# Patient Record
Sex: Male | Born: 1935 | Race: White | Hispanic: No | Marital: Married | Smoking: Former smoker
Health system: Southern US, Community
[De-identification: ages and names within clinical notes are randomized; demographics above are authoritative.]

## PROBLEM LIST (undated history)

## (undated) DIAGNOSIS — I739 Peripheral vascular disease, unspecified: Secondary | ICD-10-CM

## (undated) DIAGNOSIS — R011 Cardiac murmur, unspecified: Secondary | ICD-10-CM

## (undated) DIAGNOSIS — C801 Malignant (primary) neoplasm, unspecified: Secondary | ICD-10-CM

## (undated) DIAGNOSIS — M1990 Unspecified osteoarthritis, unspecified site: Secondary | ICD-10-CM

## (undated) DIAGNOSIS — I639 Cerebral infarction, unspecified: Secondary | ICD-10-CM

## (undated) DIAGNOSIS — I209 Angina pectoris, unspecified: Secondary | ICD-10-CM

## (undated) DIAGNOSIS — I219 Acute myocardial infarction, unspecified: Secondary | ICD-10-CM

## (undated) DIAGNOSIS — I10 Essential (primary) hypertension: Secondary | ICD-10-CM

## (undated) DIAGNOSIS — I2510 Atherosclerotic heart disease of native coronary artery without angina pectoris: Secondary | ICD-10-CM

## (undated) HISTORY — PX: HERNIA REPAIR: SHX51

## (undated) HISTORY — PX: CORONARY ARTERY BYPASS GRAFT: SHX141

## (undated) HISTORY — PX: CARDIAC CATHETERIZATION: SHX172

## (undated) HISTORY — PX: CATARACT EXTRACTION, BILATERAL: SHX1313

## (undated) HISTORY — DX: Cerebral infarction, unspecified: I63.9

---

## 1998-01-04 ENCOUNTER — Ambulatory Visit (HOSPITAL_COMMUNITY): Admission: RE | Admit: 1998-01-04 | Discharge: 1998-01-04 | Payer: Self-pay | Admitting: Urology

## 1998-06-21 ENCOUNTER — Ambulatory Visit (HOSPITAL_COMMUNITY): Admission: RE | Admit: 1998-06-21 | Discharge: 1998-06-21 | Payer: Self-pay | Admitting: Urology

## 2001-03-12 ENCOUNTER — Encounter: Payer: Self-pay | Admitting: Internal Medicine

## 2001-03-12 ENCOUNTER — Inpatient Hospital Stay (HOSPITAL_COMMUNITY): Admission: EM | Admit: 2001-03-12 | Discharge: 2001-03-13 | Payer: Self-pay | Admitting: Emergency Medicine

## 2001-06-19 ENCOUNTER — Encounter: Payer: Self-pay | Admitting: Vascular Surgery

## 2001-06-23 ENCOUNTER — Encounter (INDEPENDENT_AMBULATORY_CARE_PROVIDER_SITE_OTHER): Payer: Self-pay | Admitting: *Deleted

## 2001-06-23 ENCOUNTER — Inpatient Hospital Stay (HOSPITAL_COMMUNITY): Admission: RE | Admit: 2001-06-23 | Discharge: 2001-06-24 | Payer: Self-pay | Admitting: Vascular Surgery

## 2001-07-17 ENCOUNTER — Ambulatory Visit (HOSPITAL_COMMUNITY): Admission: RE | Admit: 2001-07-17 | Discharge: 2001-07-17 | Payer: Self-pay | Admitting: Vascular Surgery

## 2001-07-17 ENCOUNTER — Encounter: Payer: Self-pay | Admitting: Vascular Surgery

## 2002-01-08 HISTORY — PX: WRIST SURGERY: SHX841

## 2003-02-03 ENCOUNTER — Ambulatory Visit (HOSPITAL_COMMUNITY): Admission: RE | Admit: 2003-02-03 | Discharge: 2003-02-03 | Payer: Self-pay | Admitting: Urology

## 2003-05-25 ENCOUNTER — Encounter (INDEPENDENT_AMBULATORY_CARE_PROVIDER_SITE_OTHER): Payer: Self-pay | Admitting: *Deleted

## 2003-05-25 ENCOUNTER — Inpatient Hospital Stay (HOSPITAL_COMMUNITY): Admission: RE | Admit: 2003-05-25 | Discharge: 2003-05-26 | Payer: Self-pay | Admitting: Vascular Surgery

## 2003-06-23 ENCOUNTER — Ambulatory Visit (HOSPITAL_COMMUNITY): Admission: RE | Admit: 2003-06-23 | Discharge: 2003-06-24 | Payer: Self-pay | Admitting: Vascular Surgery

## 2004-10-05 ENCOUNTER — Inpatient Hospital Stay (HOSPITAL_BASED_OUTPATIENT_CLINIC_OR_DEPARTMENT_OTHER): Admission: RE | Admit: 2004-10-05 | Discharge: 2004-10-05 | Payer: Self-pay | Admitting: Interventional Cardiology

## 2004-10-10 ENCOUNTER — Ambulatory Visit (HOSPITAL_COMMUNITY): Admission: RE | Admit: 2004-10-10 | Discharge: 2004-10-11 | Payer: Self-pay | Admitting: Interventional Cardiology

## 2005-03-21 ENCOUNTER — Ambulatory Visit (HOSPITAL_COMMUNITY): Admission: RE | Admit: 2005-03-21 | Discharge: 2005-03-22 | Payer: Self-pay | Admitting: Vascular Surgery

## 2005-11-28 ENCOUNTER — Encounter: Admission: RE | Admit: 2005-11-28 | Discharge: 2005-11-28 | Payer: Self-pay | Admitting: Interventional Cardiology

## 2005-11-30 ENCOUNTER — Ambulatory Visit (HOSPITAL_COMMUNITY): Admission: RE | Admit: 2005-11-30 | Discharge: 2005-12-01 | Payer: Self-pay | Admitting: Interventional Cardiology

## 2006-05-21 ENCOUNTER — Ambulatory Visit: Payer: Self-pay | Admitting: Oncology

## 2006-06-17 ENCOUNTER — Ambulatory Visit: Payer: Self-pay | Admitting: Vascular Surgery

## 2006-07-04 LAB — COMPREHENSIVE METABOLIC PANEL
ALT: 15 U/L (ref 0–53)
BUN: 23 mg/dL (ref 6–23)
CO2: 23 mEq/L (ref 19–32)
Calcium: 9.1 mg/dL (ref 8.4–10.5)
Creatinine, Ser: 1.57 mg/dL — ABNORMAL HIGH (ref 0.40–1.50)
Glucose, Bld: 133 mg/dL — ABNORMAL HIGH (ref 70–99)
Total Bilirubin: 0.4 mg/dL (ref 0.3–1.2)

## 2006-07-04 LAB — CBC WITH DIFFERENTIAL/PLATELET
BASO%: 0.4 % (ref 0.0–2.0)
EOS%: 3.4 % (ref 0.0–7.0)
LYMPH%: 25.2 % (ref 14.0–48.0)
MCH: 31.3 pg (ref 28.0–33.4)
MCHC: 34.7 g/dL (ref 32.0–35.9)
MONO#: 0.5 10*3/uL (ref 0.1–0.9)
Platelets: 281 10*3/uL (ref 145–400)
RBC: 3.03 10*6/uL — ABNORMAL LOW (ref 4.20–5.71)
WBC: 6 10*3/uL (ref 4.0–10.0)

## 2006-07-04 LAB — CHCC SMEAR

## 2006-07-04 LAB — RETICULOCYTES: IRF: 0.43 — ABNORMAL HIGH (ref 0.070–0.380)

## 2006-07-05 ENCOUNTER — Ambulatory Visit (HOSPITAL_COMMUNITY): Admission: RE | Admit: 2006-07-05 | Discharge: 2006-07-05 | Payer: Self-pay | Admitting: Oncology

## 2006-07-08 ENCOUNTER — Ambulatory Visit: Payer: Self-pay | Admitting: Oncology

## 2006-07-08 LAB — FERRITIN: Ferritin: 26 ng/mL (ref 22–322)

## 2006-07-08 LAB — PROTEIN ELECTROPHORESIS, SERUM
Albumin ELP: 63.5 % (ref 55.8–66.1)
Alpha-1-Globulin: 4.2 % (ref 2.9–4.9)
Alpha-2-Globulin: 10.5 % (ref 7.1–11.8)

## 2006-07-15 ENCOUNTER — Other Ambulatory Visit: Admission: RE | Admit: 2006-07-15 | Discharge: 2006-07-15 | Payer: Self-pay | Admitting: Oncology

## 2006-07-15 ENCOUNTER — Encounter: Payer: Self-pay | Admitting: Oncology

## 2006-08-05 LAB — CBC WITH DIFFERENTIAL/PLATELET
Basophils Absolute: 0 10*3/uL (ref 0.0–0.1)
Eosinophils Absolute: 0.2 10*3/uL (ref 0.0–0.5)
HCT: 31.9 % — ABNORMAL LOW (ref 38.7–49.9)
HGB: 11 g/dL — ABNORMAL LOW (ref 13.0–17.1)
LYMPH%: 22.7 % (ref 14.0–48.0)
MONO#: 0.6 10*3/uL (ref 0.1–0.9)
NEUT%: 63.4 % (ref 40.0–75.0)
Platelets: 268 10*3/uL (ref 145–400)
WBC: 6.2 10*3/uL (ref 4.0–10.0)
lymph#: 1.4 10*3/uL (ref 0.9–3.3)

## 2006-08-19 LAB — CBC WITH DIFFERENTIAL/PLATELET
Basophils Absolute: 0 10*3/uL (ref 0.0–0.1)
EOS%: 3.8 % (ref 0.0–7.0)
HCT: 33.5 % — ABNORMAL LOW (ref 38.7–49.9)
HGB: 11.6 g/dL — ABNORMAL LOW (ref 13.0–17.1)
LYMPH%: 24.6 % (ref 14.0–48.0)
MCH: 31.7 pg (ref 28.0–33.4)
MCV: 92 fL (ref 81.6–98.0)
NEUT%: 61.7 % (ref 40.0–75.0)
Platelets: 292 10*3/uL (ref 145–400)
lymph#: 1.2 10*3/uL (ref 0.9–3.3)

## 2006-09-10 ENCOUNTER — Ambulatory Visit: Payer: Self-pay | Admitting: Oncology

## 2006-09-10 LAB — CBC WITH DIFFERENTIAL/PLATELET
BASO%: 0.4 % (ref 0.0–2.0)
Basophils Absolute: 0 10*3/uL (ref 0.0–0.1)
EOS%: 2.9 % (ref 0.0–7.0)
HGB: 13.8 g/dL (ref 13.0–17.1)
MCH: 31.2 pg (ref 28.0–33.4)
MCHC: 34.3 g/dL (ref 32.0–35.9)
MCV: 91.1 fL (ref 81.6–98.0)
MONO%: 7.4 % (ref 0.0–13.0)
NEUT%: 63.6 % (ref 40.0–75.0)
RDW: 14.7 % — ABNORMAL HIGH (ref 11.2–14.6)

## 2006-10-01 LAB — CBC WITH DIFFERENTIAL/PLATELET
EOS%: 2.7 % (ref 0.0–7.0)
MCH: 30.5 pg (ref 28.0–33.4)
MCV: 89.3 fL (ref 81.6–98.0)
MONO%: 7.6 % (ref 0.0–13.0)
NEUT#: 4 10*3/uL (ref 1.5–6.5)
RBC: 4.16 10*6/uL — ABNORMAL LOW (ref 4.20–5.71)
RDW: 13.9 % (ref 11.2–14.6)

## 2006-10-01 LAB — BASIC METABOLIC PANEL
BUN: 21 mg/dL (ref 6–23)
CO2: 29 mEq/L (ref 19–32)
Calcium: 9.5 mg/dL (ref 8.4–10.5)
Creatinine, Ser: 1.34 mg/dL (ref 0.40–1.50)
Glucose, Bld: 128 mg/dL — ABNORMAL HIGH (ref 70–99)

## 2006-10-01 LAB — IRON AND TIBC: %SAT: 26 % (ref 20–55)

## 2006-10-29 ENCOUNTER — Ambulatory Visit: Payer: Self-pay | Admitting: Oncology

## 2006-10-31 LAB — CBC WITH DIFFERENTIAL/PLATELET
Basophils Absolute: 0 10*3/uL (ref 0.0–0.1)
Eosinophils Absolute: 0.2 10*3/uL (ref 0.0–0.5)
HCT: 32.7 % — ABNORMAL LOW (ref 38.7–49.9)
HGB: 11.3 g/dL — ABNORMAL LOW (ref 13.0–17.1)
MCV: 88.6 fL (ref 81.6–98.0)
MONO%: 6.6 % (ref 0.0–13.0)
NEUT#: 3.9 10*3/uL (ref 1.5–6.5)
NEUT%: 64.9 % (ref 40.0–75.0)
Platelets: 246 10*3/uL (ref 145–400)
RDW: 14.3 % (ref 11.2–14.6)

## 2006-12-02 LAB — CBC WITH DIFFERENTIAL/PLATELET
Basophils Absolute: 0 10*3/uL (ref 0.0–0.1)
Eosinophils Absolute: 0.2 10*3/uL (ref 0.0–0.5)
LYMPH%: 27.8 % (ref 14.0–48.0)
MCV: 89.1 fL (ref 81.6–98.0)
MONO%: 9.3 % (ref 0.0–13.0)
NEUT#: 3.7 10*3/uL (ref 1.5–6.5)
Platelets: 299 10*3/uL (ref 145–400)
RBC: 4.01 10*6/uL — ABNORMAL LOW (ref 4.20–5.71)

## 2006-12-27 ENCOUNTER — Inpatient Hospital Stay (HOSPITAL_COMMUNITY): Admission: EM | Admit: 2006-12-27 | Discharge: 2006-12-31 | Payer: Self-pay | Admitting: Emergency Medicine

## 2006-12-27 ENCOUNTER — Ambulatory Visit: Payer: Self-pay | Admitting: Oncology

## 2007-01-01 LAB — CBC WITH DIFFERENTIAL/PLATELET
Basophils Absolute: 0 10*3/uL (ref 0.0–0.1)
Eosinophils Absolute: 0.2 10*3/uL (ref 0.0–0.5)
HGB: 9.8 g/dL — ABNORMAL LOW (ref 13.0–17.1)
NEUT#: 6.7 10*3/uL — ABNORMAL HIGH (ref 1.5–6.5)
RBC: 3.17 10*6/uL — ABNORMAL LOW (ref 4.20–5.71)
RDW: 16.8 % — ABNORMAL HIGH (ref 11.2–14.6)
WBC: 8.7 10*3/uL (ref 4.0–10.0)
lymph#: 1.3 10*3/uL (ref 0.9–3.3)

## 2007-01-27 LAB — CBC WITH DIFFERENTIAL/PLATELET
Basophils Absolute: 0.1 10*3/uL (ref 0.0–0.1)
Eosinophils Absolute: 0.2 10*3/uL (ref 0.0–0.5)
HGB: 10.3 g/dL — ABNORMAL LOW (ref 13.0–17.1)
LYMPH%: 23.4 % (ref 14.0–48.0)
MCV: 92.7 fL (ref 81.6–98.0)
MONO#: 0.4 10*3/uL (ref 0.1–0.9)
MONO%: 5.7 % (ref 0.0–13.0)
NEUT#: 4.2 10*3/uL (ref 1.5–6.5)
Platelets: 334 10*3/uL (ref 145–400)
RDW: 17 % — ABNORMAL HIGH (ref 11.2–14.6)
WBC: 6.4 10*3/uL (ref 4.0–10.0)

## 2007-01-31 ENCOUNTER — Ambulatory Visit: Payer: Self-pay | Admitting: Vascular Surgery

## 2007-02-07 LAB — CBC WITH DIFFERENTIAL/PLATELET
Eosinophils Absolute: 0.2 10*3/uL (ref 0.0–0.5)
HCT: 30.5 % — ABNORMAL LOW (ref 38.7–49.9)
LYMPH%: 23.3 % (ref 14.0–48.0)
MCV: 92.7 fL (ref 81.6–98.0)
MONO#: 0.4 10*3/uL (ref 0.1–0.9)
MONO%: 5.9 % (ref 0.0–13.0)
NEUT#: 4.5 10*3/uL (ref 1.5–6.5)
NEUT%: 66.9 % (ref 40.0–75.0)
Platelets: 390 10*3/uL (ref 145–400)
RBC: 3.29 10*6/uL — ABNORMAL LOW (ref 4.20–5.71)
WBC: 6.7 10*3/uL (ref 4.0–10.0)

## 2007-02-07 LAB — IRON AND TIBC
%SAT: 16 % — ABNORMAL LOW (ref 20–55)
Iron: 54 ug/dL (ref 42–165)

## 2007-02-07 LAB — FERRITIN: Ferritin: 43 ng/mL (ref 22–322)

## 2007-03-05 ENCOUNTER — Ambulatory Visit: Payer: Self-pay | Admitting: Oncology

## 2007-03-07 LAB — CBC WITH DIFFERENTIAL/PLATELET
BASO%: 0.6 % (ref 0.0–2.0)
Eosinophils Absolute: 0.2 10*3/uL (ref 0.0–0.5)
LYMPH%: 20.3 % (ref 14.0–48.0)
MCHC: 33.8 g/dL (ref 32.0–35.9)
MCV: 93.3 fL (ref 81.6–98.0)
MONO%: 7.4 % (ref 0.0–13.0)
NEUT%: 69.3 % (ref 40.0–75.0)
Platelets: 338 10*3/uL (ref 145–400)
RBC: 3.5 10*6/uL — ABNORMAL LOW (ref 4.20–5.71)

## 2007-04-30 ENCOUNTER — Ambulatory Visit: Payer: Self-pay | Admitting: Oncology

## 2007-05-02 LAB — CBC WITH DIFFERENTIAL/PLATELET
Eosinophils Absolute: 0.3 10*3/uL (ref 0.0–0.5)
HCT: 32.3 % — ABNORMAL LOW (ref 38.7–49.9)
LYMPH%: 24.3 % (ref 14.0–48.0)
MCHC: 34.5 g/dL (ref 32.0–35.9)
MONO#: 0.6 10*3/uL (ref 0.1–0.9)
NEUT#: 4.8 10*3/uL (ref 1.5–6.5)
NEUT%: 64.1 % (ref 40.0–75.0)
Platelets: 304 10*3/uL (ref 145–400)
WBC: 7.5 10*3/uL (ref 4.0–10.0)

## 2007-05-02 LAB — FERRITIN: Ferritin: 29 ng/mL (ref 22–322)

## 2007-05-02 LAB — IRON AND TIBC: %SAT: 18 % — ABNORMAL LOW (ref 20–55)

## 2007-06-25 ENCOUNTER — Ambulatory Visit: Payer: Self-pay | Admitting: Oncology

## 2007-06-27 LAB — CBC WITH DIFFERENTIAL/PLATELET
Basophils Absolute: 0.1 10*3/uL (ref 0.0–0.1)
Eosinophils Absolute: 0.2 10*3/uL (ref 0.0–0.5)
HGB: 11.4 g/dL — ABNORMAL LOW (ref 13.0–17.1)
LYMPH%: 21.7 % (ref 14.0–48.0)
MONO#: 0.4 10*3/uL (ref 0.1–0.9)
NEUT#: 4.3 10*3/uL (ref 1.5–6.5)
Platelets: 318 10*3/uL (ref 145–400)
RBC: 3.67 10*6/uL — ABNORMAL LOW (ref 4.20–5.71)
WBC: 6.3 10*3/uL (ref 4.0–10.0)

## 2007-07-25 ENCOUNTER — Ambulatory Visit: Payer: Self-pay | Admitting: Vascular Surgery

## 2007-08-08 LAB — CBC WITH DIFFERENTIAL/PLATELET
BASO%: 0.3 % (ref 0.0–2.0)
HCT: 31 % — ABNORMAL LOW (ref 38.7–49.9)
MCHC: 33.9 g/dL (ref 32.0–35.9)
MONO#: 0.4 10*3/uL (ref 0.1–0.9)
NEUT%: 64.8 % (ref 40.0–75.0)
RBC: 3.36 10*6/uL — ABNORMAL LOW (ref 4.20–5.71)
RDW: 15.9 % — ABNORMAL HIGH (ref 11.2–14.6)
WBC: 6.8 10*3/uL (ref 4.0–10.0)
lymph#: 1.8 10*3/uL (ref 0.9–3.3)

## 2007-08-08 LAB — IRON AND TIBC: UIBC: 277 ug/dL

## 2007-08-12 ENCOUNTER — Ambulatory Visit: Payer: Self-pay | Admitting: Surgery

## 2007-08-12 ENCOUNTER — Ambulatory Visit (HOSPITAL_COMMUNITY): Admission: RE | Admit: 2007-08-12 | Discharge: 2007-08-13 | Payer: Self-pay | Admitting: Surgery

## 2007-08-21 ENCOUNTER — Ambulatory Visit (HOSPITAL_COMMUNITY): Admission: RE | Admit: 2007-08-21 | Discharge: 2007-08-23 | Payer: Self-pay | Admitting: Surgery

## 2007-08-21 ENCOUNTER — Encounter: Payer: Self-pay | Admitting: Vascular Surgery

## 2007-08-22 ENCOUNTER — Encounter: Payer: Self-pay | Admitting: Surgery

## 2007-08-29 ENCOUNTER — Ambulatory Visit: Payer: Self-pay | Admitting: Vascular Surgery

## 2007-09-16 ENCOUNTER — Ambulatory Visit: Payer: Self-pay | Admitting: Oncology

## 2007-09-19 LAB — CBC WITH DIFFERENTIAL/PLATELET
Basophils Absolute: 0 10*3/uL (ref 0.0–0.1)
Eosinophils Absolute: 0.3 10*3/uL (ref 0.0–0.5)
HGB: 10 g/dL — ABNORMAL LOW (ref 13.0–17.1)
MCV: 92.8 fL (ref 81.6–98.0)
MONO%: 7 % (ref 0.0–13.0)
NEUT#: 4.3 10*3/uL (ref 1.5–6.5)
Platelets: 283 10*3/uL (ref 145–400)
RDW: 15.7 % — ABNORMAL HIGH (ref 11.2–14.6)

## 2007-09-19 LAB — IRON AND TIBC
%SAT: 16 % — ABNORMAL LOW (ref 20–55)
Iron: 56 ug/dL (ref 42–165)

## 2007-09-22 ENCOUNTER — Ambulatory Visit: Payer: Self-pay | Admitting: Surgery

## 2007-10-28 LAB — CBC WITH DIFFERENTIAL/PLATELET
Basophils Absolute: 0 10*3/uL (ref 0.0–0.1)
Eosinophils Absolute: 0.3 10*3/uL (ref 0.0–0.5)
HGB: 10.4 g/dL — ABNORMAL LOW (ref 13.0–17.1)
LYMPH%: 24.5 % (ref 14.0–48.0)
MCV: 92.8 fL (ref 81.6–98.0)
MONO#: 0.4 10*3/uL (ref 0.1–0.9)
MONO%: 6.3 % (ref 0.0–13.0)
NEUT#: 4.2 10*3/uL (ref 1.5–6.5)
Platelets: 270 10*3/uL (ref 145–400)
WBC: 6.4 10*3/uL (ref 4.0–10.0)

## 2007-10-28 LAB — IRON AND TIBC
%SAT: 20 % (ref 20–55)
Iron: 72 ug/dL (ref 42–165)
UIBC: 291 ug/dL

## 2007-10-28 LAB — FERRITIN: Ferritin: 21 ng/mL — ABNORMAL LOW (ref 22–322)

## 2007-11-17 ENCOUNTER — Ambulatory Visit: Payer: Self-pay | Admitting: Surgery

## 2007-11-21 ENCOUNTER — Ambulatory Visit: Payer: Self-pay | Admitting: Oncology

## 2007-11-25 LAB — CBC WITH DIFFERENTIAL/PLATELET
Basophils Absolute: 0 10*3/uL (ref 0.0–0.1)
Eosinophils Absolute: 0.2 10*3/uL (ref 0.0–0.5)
HCT: 32.6 % — ABNORMAL LOW (ref 38.7–49.9)
HGB: 11.1 g/dL — ABNORMAL LOW (ref 13.0–17.1)
LYMPH%: 29.4 % (ref 14.0–48.0)
MONO#: 0.4 10*3/uL (ref 0.1–0.9)
NEUT#: 3.1 10*3/uL (ref 1.5–6.5)
NEUT%: 58.2 % (ref 40.0–75.0)
Platelets: 273 10*3/uL (ref 145–400)
WBC: 5.3 10*3/uL (ref 4.0–10.0)
lymph#: 1.6 10*3/uL (ref 0.9–3.3)

## 2007-12-01 ENCOUNTER — Inpatient Hospital Stay (HOSPITAL_COMMUNITY): Admission: AD | Admit: 2007-12-01 | Discharge: 2007-12-02 | Payer: Self-pay | Admitting: Interventional Cardiology

## 2007-12-23 LAB — CBC WITH DIFFERENTIAL/PLATELET
Basophils Absolute: 0 10*3/uL (ref 0.0–0.1)
Eosinophils Absolute: 0.3 10*3/uL (ref 0.0–0.5)
HCT: 33.2 % — ABNORMAL LOW (ref 38.7–49.9)
HGB: 11.2 g/dL — ABNORMAL LOW (ref 13.0–17.1)
LYMPH%: 25.4 % (ref 14.0–48.0)
MONO#: 0.4 10*3/uL (ref 0.1–0.9)
NEUT#: 2.8 10*3/uL (ref 1.5–6.5)
NEUT%: 59.5 % (ref 40.0–75.0)
Platelets: 231 10*3/uL (ref 145–400)
WBC: 4.7 10*3/uL (ref 4.0–10.0)
lymph#: 1.2 10*3/uL (ref 0.9–3.3)

## 2008-01-15 ENCOUNTER — Ambulatory Visit (HOSPITAL_COMMUNITY): Admission: RE | Admit: 2008-01-15 | Discharge: 2008-01-15 | Payer: Self-pay | Admitting: Gastroenterology

## 2008-01-15 ENCOUNTER — Encounter (INDEPENDENT_AMBULATORY_CARE_PROVIDER_SITE_OTHER): Payer: Self-pay | Admitting: Gastroenterology

## 2008-01-16 ENCOUNTER — Ambulatory Visit: Payer: Self-pay | Admitting: Oncology

## 2008-01-20 LAB — CBC WITH DIFFERENTIAL/PLATELET
Eosinophils Absolute: 0.2 10*3/uL (ref 0.0–0.5)
LYMPH%: 26 % (ref 14.0–48.0)
MONO#: 0.4 10*3/uL (ref 0.1–0.9)
NEUT#: 3.5 10*3/uL (ref 1.5–6.5)
Platelets: 236 10*3/uL (ref 145–400)
RBC: 3.74 10*6/uL — ABNORMAL LOW (ref 4.20–5.71)
WBC: 5.6 10*3/uL (ref 4.0–10.0)
lymph#: 1.5 10*3/uL (ref 0.9–3.3)

## 2008-02-16 ENCOUNTER — Ambulatory Visit: Payer: Self-pay | Admitting: Surgery

## 2008-02-27 ENCOUNTER — Ambulatory Visit: Payer: Self-pay | Admitting: Vascular Surgery

## 2008-03-01 LAB — FERRITIN: Ferritin: 25 ng/mL (ref 22–322)

## 2008-03-01 LAB — CBC WITH DIFFERENTIAL/PLATELET
Basophils Absolute: 0 10*3/uL (ref 0.0–0.1)
EOS%: 4.5 % (ref 0.0–7.0)
HCT: 29.9 % — ABNORMAL LOW (ref 38.4–49.9)
HGB: 10.1 g/dL — ABNORMAL LOW (ref 13.0–17.1)
MCH: 30.7 pg (ref 27.2–33.4)
MCV: 90.6 fL (ref 79.3–98.0)
MONO%: 7.5 % (ref 0.0–14.0)
NEUT%: 66 % (ref 39.0–75.0)
lymph#: 1.3 10*3/uL (ref 0.9–3.3)

## 2008-03-01 LAB — IRON AND TIBC: TIBC: 367 ug/dL (ref 215–435)

## 2008-03-25 ENCOUNTER — Ambulatory Visit: Payer: Self-pay | Admitting: Oncology

## 2008-03-29 LAB — CBC WITH DIFFERENTIAL/PLATELET
Basophils Absolute: 0 10*3/uL (ref 0.0–0.1)
Eosinophils Absolute: 0.3 10*3/uL (ref 0.0–0.5)
HCT: 31 % — ABNORMAL LOW (ref 38.4–49.9)
HGB: 9.9 g/dL — ABNORMAL LOW (ref 13.0–17.1)
LYMPH%: 23.6 % (ref 14.0–49.0)
MCV: 90.1 fL (ref 79.3–98.0)
MONO#: 0.5 10*3/uL (ref 0.1–0.9)
MONO%: 7.5 % (ref 0.0–14.0)
NEUT#: 4.3 10*3/uL (ref 1.5–6.5)
Platelets: 269 10*3/uL (ref 140–400)
WBC: 6.7 10*3/uL (ref 4.0–10.3)
nRBC: 0 % (ref 0–0)

## 2008-03-29 LAB — IRON AND TIBC
%SAT: 17 % — ABNORMAL LOW (ref 20–55)
TIBC: 390 ug/dL (ref 215–435)
UIBC: 325 ug/dL

## 2008-04-26 LAB — CBC WITH DIFFERENTIAL/PLATELET
Basophils Absolute: 0 10*3/uL (ref 0.0–0.1)
Eosinophils Absolute: 0.3 10*3/uL (ref 0.0–0.5)
HGB: 10.8 g/dL — ABNORMAL LOW (ref 13.0–17.1)
MCV: 90 fL (ref 79.3–98.0)
MONO#: 0.4 10*3/uL (ref 0.1–0.9)
NEUT#: 3.6 10*3/uL (ref 1.5–6.5)
RBC: 3.55 10*6/uL — ABNORMAL LOW (ref 4.20–5.82)
RDW: 16 % — ABNORMAL HIGH (ref 11.0–14.6)
WBC: 5.4 10*3/uL (ref 4.0–10.3)
lymph#: 1.2 10*3/uL (ref 0.9–3.3)

## 2008-05-20 ENCOUNTER — Ambulatory Visit: Payer: Self-pay | Admitting: Oncology

## 2008-05-24 LAB — IRON AND TIBC
%SAT: 23 % (ref 20–55)
Iron: 80 ug/dL (ref 42–165)
TIBC: 347 ug/dL (ref 215–435)
UIBC: 267 ug/dL

## 2008-05-24 LAB — FERRITIN: Ferritin: 52 ng/mL (ref 22–322)

## 2008-05-24 LAB — CBC WITH DIFFERENTIAL/PLATELET
BASO%: 0.8 % (ref 0.0–2.0)
HCT: 32.5 % — ABNORMAL LOW (ref 38.4–49.9)
HGB: 11.1 g/dL — ABNORMAL LOW (ref 13.0–17.1)
MCHC: 34.1 g/dL (ref 32.0–36.0)
MONO#: 0.4 10*3/uL (ref 0.1–0.9)
NEUT%: 60.1 % (ref 39.0–75.0)
RDW: 16.4 % — ABNORMAL HIGH (ref 11.0–14.6)
WBC: 5 10*3/uL (ref 4.0–10.3)
lymph#: 1.3 10*3/uL (ref 0.9–3.3)

## 2008-05-25 LAB — BASIC METABOLIC PANEL
Calcium: 9.4 mg/dL (ref 8.4–10.5)
Chloride: 107 mEq/L (ref 96–112)
Creatinine, Ser: 2.18 mg/dL — ABNORMAL HIGH (ref 0.40–1.50)

## 2008-06-04 ENCOUNTER — Ambulatory Visit: Payer: Self-pay | Admitting: Vascular Surgery

## 2008-06-05 ENCOUNTER — Emergency Department (HOSPITAL_COMMUNITY): Admission: EM | Admit: 2008-06-05 | Discharge: 2008-06-05 | Payer: Self-pay | Admitting: Emergency Medicine

## 2008-06-23 ENCOUNTER — Encounter: Admission: RE | Admit: 2008-06-23 | Discharge: 2008-06-23 | Payer: Self-pay | Admitting: Internal Medicine

## 2008-06-24 LAB — CBC WITH DIFFERENTIAL/PLATELET
BASO%: 0.5 % (ref 0.0–2.0)
EOS%: 5 % (ref 0.0–7.0)
LYMPH%: 21.6 % (ref 14.0–49.0)
MCHC: 35 g/dL (ref 32.0–36.0)
MONO#: 0.4 10*3/uL (ref 0.1–0.9)
RBC: 3.52 10*6/uL — ABNORMAL LOW (ref 4.20–5.82)
WBC: 5.5 10*3/uL (ref 4.0–10.3)
lymph#: 1.2 10*3/uL (ref 0.9–3.3)

## 2008-07-20 ENCOUNTER — Ambulatory Visit: Payer: Self-pay | Admitting: Oncology

## 2008-07-22 LAB — CBC WITH DIFFERENTIAL/PLATELET
BASO%: 0.6 % (ref 0.0–2.0)
LYMPH%: 28 % (ref 14.0–49.0)
MCH: 30.4 pg (ref 27.2–33.4)
MCHC: 33.2 g/dL (ref 32.0–36.0)
MCV: 91.5 fL (ref 79.3–98.0)
MONO%: 9.4 % (ref 0.0–14.0)
Platelets: 263 10*3/uL (ref 140–400)
RBC: 3.78 10*6/uL — ABNORMAL LOW (ref 4.20–5.82)

## 2008-08-20 ENCOUNTER — Ambulatory Visit: Payer: Self-pay | Admitting: Oncology

## 2008-08-24 LAB — CBC WITH DIFFERENTIAL/PLATELET
Basophils Absolute: 0 10*3/uL (ref 0.0–0.1)
Eosinophils Absolute: 0.2 10*3/uL (ref 0.0–0.5)
HGB: 10.9 g/dL — ABNORMAL LOW (ref 13.0–17.1)
MONO#: 0.4 10*3/uL (ref 0.1–0.9)
NEUT#: 3 10*3/uL (ref 1.5–6.5)
RBC: 3.37 10*6/uL — ABNORMAL LOW (ref 4.20–5.82)
RDW: 15.2 % — ABNORMAL HIGH (ref 11.0–14.6)
WBC: 4.7 10*3/uL (ref 4.0–10.3)

## 2008-08-24 LAB — IRON AND TIBC
%SAT: 26 % (ref 20–55)
Iron: 91 ug/dL (ref 42–165)
TIBC: 352 ug/dL (ref 215–435)
UIBC: 261 ug/dL

## 2008-09-10 ENCOUNTER — Ambulatory Visit: Payer: Self-pay | Admitting: Vascular Surgery

## 2008-09-21 ENCOUNTER — Ambulatory Visit: Payer: Self-pay | Admitting: Oncology

## 2008-09-21 LAB — CBC WITH DIFFERENTIAL/PLATELET
EOS%: 3.9 % (ref 0.0–7.0)
Eosinophils Absolute: 0.2 10*3/uL (ref 0.0–0.5)
MCH: 30.7 pg (ref 27.2–33.4)
MCV: 92.8 fL (ref 79.3–98.0)
MONO%: 8.8 % (ref 0.0–14.0)
NEUT#: 3.4 10*3/uL (ref 1.5–6.5)
RBC: 3.62 10*6/uL — ABNORMAL LOW (ref 4.20–5.82)
RDW: 13.9 % (ref 11.0–14.6)
lymph#: 1.2 10*3/uL (ref 0.9–3.3)

## 2008-10-19 LAB — CBC WITH DIFFERENTIAL/PLATELET
BASO%: 0.5 % (ref 0.0–2.0)
Basophils Absolute: 0 10*3/uL (ref 0.0–0.1)
EOS%: 4.7 % (ref 0.0–7.0)
HGB: 13 g/dL (ref 13.0–17.1)
MCH: 31.4 pg (ref 27.2–33.4)
MCHC: 34.2 g/dL (ref 32.0–36.0)
MCV: 91.9 fL (ref 79.3–98.0)
MONO%: 8.6 % (ref 0.0–14.0)
RDW: 13.5 % (ref 11.0–14.6)
lymph#: 1.1 10*3/uL (ref 0.9–3.3)

## 2008-11-12 ENCOUNTER — Ambulatory Visit: Payer: Self-pay | Admitting: Oncology

## 2008-11-16 LAB — CBC WITH DIFFERENTIAL/PLATELET
BASO%: 0.4 % (ref 0.0–2.0)
Basophils Absolute: 0 10*3/uL (ref 0.0–0.1)
EOS%: 3.8 % (ref 0.0–7.0)
HCT: 35.3 % — ABNORMAL LOW (ref 38.4–49.9)
HGB: 12.2 g/dL — ABNORMAL LOW (ref 13.0–17.1)
LYMPH%: 25.1 % (ref 14.0–49.0)
MCH: 31.7 pg (ref 27.2–33.4)
MCHC: 34.5 g/dL (ref 32.0–36.0)
MCV: 91.8 fL (ref 79.3–98.0)
MONO%: 7.8 % (ref 0.0–14.0)
NEUT%: 62.9 % (ref 39.0–75.0)
Platelets: 247 10*3/uL (ref 140–400)
lymph#: 1.3 10*3/uL (ref 0.9–3.3)

## 2008-12-10 ENCOUNTER — Ambulatory Visit: Payer: Self-pay | Admitting: Oncology

## 2008-12-14 LAB — CBC WITH DIFFERENTIAL/PLATELET
BASO%: 0.6 % (ref 0.0–2.0)
Eosinophils Absolute: 0.2 10*3/uL (ref 0.0–0.5)
LYMPH%: 23 % (ref 14.0–49.0)
MCHC: 34.2 g/dL (ref 32.0–36.0)
MCV: 93.3 fL (ref 79.3–98.0)
MONO%: 7.1 % (ref 0.0–14.0)
NEUT#: 4.2 10*3/uL (ref 1.5–6.5)
Platelets: 307 10*3/uL (ref 140–400)
RBC: 4.01 10*6/uL — ABNORMAL LOW (ref 4.20–5.82)
RDW: 16.5 % — ABNORMAL HIGH (ref 11.0–14.6)
WBC: 6.3 10*3/uL (ref 4.0–10.3)

## 2008-12-14 LAB — IRON AND TIBC
%SAT: 16 % — ABNORMAL LOW (ref 20–55)
Iron: 66 ug/dL (ref 42–165)

## 2008-12-14 LAB — FERRITIN: Ferritin: 79 ng/mL (ref 22–322)

## 2009-01-11 ENCOUNTER — Ambulatory Visit: Payer: Self-pay | Admitting: Oncology

## 2009-01-11 LAB — CBC WITH DIFFERENTIAL/PLATELET
BASO%: 0.8 % (ref 0.0–2.0)
Basophils Absolute: 0 10*3/uL (ref 0.0–0.1)
NEUT%: 63.9 % (ref 39.0–75.0)
Platelets: 258 10*3/uL (ref 140–400)
RBC: 3.7 10*6/uL — ABNORMAL LOW (ref 4.20–5.82)
RDW: 15.9 % — ABNORMAL HIGH (ref 11.0–14.6)
lymph#: 1.3 10*3/uL (ref 0.9–3.3)

## 2009-03-04 ENCOUNTER — Ambulatory Visit: Payer: Self-pay | Admitting: Oncology

## 2009-03-08 LAB — CBC WITH DIFFERENTIAL/PLATELET
BASO%: 0.7 % (ref 0.0–2.0)
EOS%: 3.6 % (ref 0.0–7.0)
HCT: 33.8 % — ABNORMAL LOW (ref 38.4–49.9)
HGB: 11.6 g/dL — ABNORMAL LOW (ref 13.0–17.1)
MCH: 31.8 pg (ref 27.2–33.4)
MCHC: 34.2 g/dL (ref 32.0–36.0)
MCV: 93.2 fL (ref 79.3–98.0)
MONO#: 0.5 10*3/uL (ref 0.1–0.9)
MONO%: 8.7 % (ref 0.0–14.0)
NEUT#: 3.1 10*3/uL (ref 1.5–6.5)
NEUT%: 59.5 % (ref 39.0–75.0)
RBC: 3.63 10*6/uL — ABNORMAL LOW (ref 4.20–5.82)

## 2009-04-05 ENCOUNTER — Ambulatory Visit: Payer: Self-pay | Admitting: Oncology

## 2009-04-05 LAB — IRON AND TIBC
%SAT: 23 % (ref 20–55)
Iron: 80 ug/dL (ref 42–165)

## 2009-04-05 LAB — CBC WITH DIFFERENTIAL/PLATELET
BASO%: 0.6 % (ref 0.0–2.0)
HCT: 32.9 % — ABNORMAL LOW (ref 38.4–49.9)
LYMPH%: 26.8 % (ref 14.0–49.0)
MONO%: 8 % (ref 0.0–14.0)
NEUT#: 2.9 10*3/uL (ref 1.5–6.5)
NEUT%: 61.3 % (ref 39.0–75.0)
Platelets: 295 10*3/uL (ref 140–400)
RBC: 3.51 10*6/uL — ABNORMAL LOW (ref 4.20–5.82)
RDW: 14.6 % (ref 11.0–14.6)
WBC: 4.7 10*3/uL (ref 4.0–10.3)

## 2009-04-05 LAB — FERRITIN: Ferritin: 42 ng/mL (ref 22–322)

## 2009-05-13 ENCOUNTER — Ambulatory Visit: Payer: Self-pay | Admitting: Oncology

## 2009-05-16 LAB — CBC WITH DIFFERENTIAL/PLATELET
Eosinophils Absolute: 0.1 10*3/uL (ref 0.0–0.5)
HCT: 30.9 % — ABNORMAL LOW (ref 38.4–49.9)
MCH: 31.7 pg (ref 27.2–33.4)
MCHC: 34 g/dL (ref 32.0–36.0)
MCV: 93.1 fL (ref 79.3–98.0)
MONO#: 0.4 10*3/uL (ref 0.1–0.9)
NEUT#: 2.6 10*3/uL (ref 1.5–6.5)
RBC: 3.31 10*6/uL — ABNORMAL LOW (ref 4.20–5.82)
lymph#: 1.1 10*3/uL (ref 0.9–3.3)

## 2009-06-24 ENCOUNTER — Ambulatory Visit: Payer: Self-pay | Admitting: Oncology

## 2009-06-28 LAB — CBC WITH DIFFERENTIAL/PLATELET
Basophils Absolute: 0 10*3/uL (ref 0.0–0.1)
EOS%: 2.5 % (ref 0.0–7.0)
LYMPH%: 24.5 % (ref 14.0–49.0)
MCHC: 34.3 g/dL (ref 32.0–36.0)
MONO#: 0.4 10*3/uL (ref 0.1–0.9)
MONO%: 8 % (ref 0.0–14.0)
NEUT#: 3.3 10*3/uL (ref 1.5–6.5)
NEUT%: 64.3 % (ref 39.0–75.0)
Platelets: 298 10*3/uL (ref 140–400)
WBC: 5.2 10*3/uL (ref 4.0–10.3)
lymph#: 1.3 10*3/uL (ref 0.9–3.3)

## 2009-08-08 ENCOUNTER — Ambulatory Visit: Payer: Self-pay | Admitting: Oncology

## 2009-08-09 LAB — IRON AND TIBC
%SAT: 14 % — ABNORMAL LOW (ref 20–55)
UIBC: 295 ug/dL

## 2009-08-09 LAB — CBC WITH DIFFERENTIAL/PLATELET
Eosinophils Absolute: 0.2 10*3/uL (ref 0.0–0.5)
HCT: 33.9 % — ABNORMAL LOW (ref 38.4–49.9)
HGB: 11.4 g/dL — ABNORMAL LOW (ref 13.0–17.1)
LYMPH%: 21.1 % (ref 14.0–49.0)
MCHC: 33.8 g/dL (ref 32.0–36.0)
MONO#: 0.4 10*3/uL (ref 0.1–0.9)
NEUT#: 3.5 10*3/uL (ref 1.5–6.5)
NEUT%: 66.9 % (ref 39.0–75.0)
RDW: 14.8 % — ABNORMAL HIGH (ref 11.0–14.6)

## 2009-08-09 LAB — FERRITIN: Ferritin: 22 ng/mL (ref 22–322)

## 2009-09-16 ENCOUNTER — Encounter: Admission: RE | Admit: 2009-09-16 | Discharge: 2009-09-16 | Payer: Self-pay | Admitting: Surgery

## 2009-10-11 ENCOUNTER — Ambulatory Visit: Payer: Self-pay | Admitting: Oncology

## 2009-10-13 LAB — CBC WITH DIFFERENTIAL/PLATELET
BASO%: 0.5 % (ref 0.0–2.0)
HCT: 34.4 % — ABNORMAL LOW (ref 38.4–49.9)
HGB: 12 g/dL — ABNORMAL LOW (ref 13.0–17.1)
MCHC: 34.8 g/dL (ref 32.0–36.0)
MCV: 88.8 fL (ref 79.3–98.0)
MONO#: 0.5 10*3/uL (ref 0.1–0.9)
MONO%: 7.3 % (ref 0.0–14.0)
NEUT#: 5 10*3/uL (ref 1.5–6.5)
Platelets: 279 10*3/uL (ref 140–400)
WBC: 6.8 10*3/uL (ref 4.0–10.3)

## 2009-10-13 LAB — IRON AND TIBC
%SAT: 16 % — ABNORMAL LOW (ref 20–55)
Iron: 52 ug/dL (ref 42–165)
UIBC: 269 ug/dL

## 2009-10-19 ENCOUNTER — Inpatient Hospital Stay (HOSPITAL_COMMUNITY): Admission: RE | Admit: 2009-10-19 | Discharge: 2009-10-22 | Payer: Self-pay | Admitting: Surgery

## 2009-12-06 ENCOUNTER — Ambulatory Visit (HOSPITAL_BASED_OUTPATIENT_CLINIC_OR_DEPARTMENT_OTHER): Payer: Medicare Other | Admitting: Oncology

## 2009-12-08 LAB — CBC WITH DIFFERENTIAL/PLATELET
Basophils Absolute: 0 10*3/uL (ref 0.0–0.1)
Eosinophils Absolute: 0.1 10*3/uL (ref 0.0–0.5)
MCH: 30.7 pg (ref 27.2–33.4)
MCHC: 34.2 g/dL (ref 32.0–36.0)
MONO%: 8.2 % (ref 0.0–14.0)
Platelets: 335 10*3/uL (ref 140–400)
RBC: 3.85 10*6/uL — ABNORMAL LOW (ref 4.20–5.82)
WBC: 5.9 10*3/uL (ref 4.0–10.3)
lymph#: 1.2 10*3/uL (ref 0.9–3.3)

## 2009-12-19 ENCOUNTER — Ambulatory Visit: Payer: Self-pay | Admitting: Vascular Surgery

## 2010-02-09 ENCOUNTER — Ambulatory Visit (HOSPITAL_BASED_OUTPATIENT_CLINIC_OR_DEPARTMENT_OTHER): Payer: Medicare Other | Admitting: Oncology

## 2010-02-09 DIAGNOSIS — D649 Anemia, unspecified: Secondary | ICD-10-CM

## 2010-02-09 LAB — IRON AND TIBC
%SAT: 21 % (ref 20–55)
Iron: 67 ug/dL (ref 42–165)
TIBC: 318 ug/dL (ref 215–435)
UIBC: 251 ug/dL

## 2010-02-09 LAB — FERRITIN: Ferritin: 35 ng/mL (ref 22–322)

## 2010-02-09 LAB — CBC WITH DIFFERENTIAL/PLATELET
Eosinophils Absolute: 0.1 10*3/uL (ref 0.0–0.5)
MCHC: 34.1 g/dL (ref 32.0–36.0)
MONO#: 0.4 10*3/uL (ref 0.1–0.9)
MONO%: 6.7 % (ref 0.0–14.0)
NEUT#: 4.2 10*3/uL (ref 1.5–6.5)
RBC: 3.76 10*6/uL — ABNORMAL LOW (ref 4.20–5.82)
lymph#: 1.2 10*3/uL (ref 0.9–3.3)

## 2010-03-22 LAB — GLUCOSE, CAPILLARY
Glucose-Capillary: 103 mg/dL — ABNORMAL HIGH (ref 70–99)
Glucose-Capillary: 113 mg/dL — ABNORMAL HIGH (ref 70–99)
Glucose-Capillary: 125 mg/dL — ABNORMAL HIGH (ref 70–99)
Glucose-Capillary: 127 mg/dL — ABNORMAL HIGH (ref 70–99)
Glucose-Capillary: 159 mg/dL — ABNORMAL HIGH (ref 70–99)
Glucose-Capillary: 46 mg/dL — ABNORMAL LOW (ref 70–99)
Glucose-Capillary: 59 mg/dL — ABNORMAL LOW (ref 70–99)

## 2010-03-22 LAB — BASIC METABOLIC PANEL
GFR calc Af Amer: 52 mL/min — ABNORMAL LOW (ref >60)
GFR calc non Af Amer: 43 mL/min — ABNORMAL LOW (ref >60)
Potassium: 4.5 mEq/L (ref 3.5–5.1)
Sodium: 140 mEq/L (ref 135–145)

## 2010-03-22 LAB — COMPREHENSIVE METABOLIC PANEL
ALT: 11 U/L (ref 0–53)
Albumin: 4 g/dL (ref 3.5–5.2)
Alkaline Phosphatase: 76 U/L (ref 39–117)
Potassium: 4.8 mEq/L (ref 3.5–5.1)
Sodium: 140 mEq/L (ref 135–145)
Total Protein: 6.9 g/dL (ref 6.0–8.3)

## 2010-03-22 LAB — CBC
HCT: 34.7 % — ABNORMAL LOW (ref 39.0–52.0)
HCT: 36.3 % — ABNORMAL LOW (ref 39.0–52.0)
Hemoglobin: 11.7 g/dL — ABNORMAL LOW (ref 13.0–17.0)
MCHC: 34.2 g/dL (ref 30.0–36.0)
MCV: 89.2 fL (ref 78.0–100.0)
Platelets: 270 10*3/uL (ref 150–400)
RBC: 3.88 MIL/uL — ABNORMAL LOW (ref 4.22–5.81)
RDW: 15.1 % (ref 11.5–15.5)
RDW: 15.2 % (ref 11.5–15.5)
WBC: 6.7 10*3/uL (ref 4.0–10.5)
WBC: 7.6 10*3/uL (ref 4.0–10.5)

## 2010-03-22 LAB — CREATININE, SERUM: GFR calc non Af Amer: 36 mL/min — ABNORMAL LOW (ref >60)

## 2010-03-22 LAB — SURGICAL PCR SCREEN: Staphylococcus aureus: NEGATIVE

## 2010-03-22 LAB — BUN: BUN: 29 mg/dL — ABNORMAL HIGH (ref 6–23)

## 2010-04-18 LAB — POCT I-STAT, CHEM 8
BUN: 39 mg/dL — ABNORMAL HIGH (ref 6–23)
Calcium, Ion: 1.08 mmol/L — ABNORMAL LOW (ref 1.12–1.32)
Chloride: 109 meq/L (ref 96–112)
Creatinine, Ser: 1.8 mg/dL — ABNORMAL HIGH (ref 0.4–1.5)
Glucose, Bld: 112 mg/dL — ABNORMAL HIGH (ref 70–99)
HCT: 30 % — ABNORMAL LOW (ref 39.0–52.0)
Hemoglobin: 10.2 g/dL — ABNORMAL LOW (ref 13.0–17.0)
Potassium: 4.6 meq/L (ref 3.5–5.1)
Sodium: 140 mEq/L (ref 135–145)
TCO2: 20 mmol/L (ref 0–100)

## 2010-05-23 NOTE — Procedures (Signed)
BYPASS GRAFT EVALUATION   INDICATION:  Follow-up evaluation of left leg stent.  Left leg  claudication history.   HISTORY:  Diabetes:  Orally controlled.  Cardiac:  Coronary artery bypass graft on 03/09/1995.  Hypertension:  Yes.  Smoking:  Former smoker.  Previous Surgery:  Left superficial femoral artery stent on 08/12/2007.   SINGLE LEVEL ARTERIAL EXAM                               RIGHT              LEFT  Brachial:                    124                138  Anterior tibial:             138                109  Posterior tibial:            137                107  Peroneal:  Ankle/brachial index:        >1.0               0.79   PREVIOUS ABI:  Date: 11/17/2007  RIGHT:  0.98  LEFT:  0.95   LOWER EXTREMITY BYPASS GRAFT DUPLEX EXAM:   DUPLEX:  Doppler arterial waveforms are biphasic throughout the left  leg.  The left common femoral artery peak systolic velocity is 402 cm/s.  There are no elevated velocities seen within the stent.   IMPRESSION:  1. Left ankle brachial index is lower than previously recorded and      suggests mild-to-moderate occlusive disease.  2. Patent left superficial femoral artery stent.  3. Left common femoral artery/external iliac artery stenosis.  4. Right ankle brachial index is stable compared to previous study and      suggests no significant arterial occlusive disease in the right      leg.   ___________________________________________  V. Charlena Cross, MD   MC/MEDQ  D:  02/16/2008  T:  02/16/2008  Job:  [id number]

## 2010-05-23 NOTE — Procedures (Signed)
BYPASS GRAFT EVALUATION   INDICATION:  Left superficial femoral artery stent.   HISTORY:  Diabetes:  Yes.  Cardiac:  CABG, MI.  Hypertension:  Yes.  Smoking:  Previous.  Previous Surgery:  Left superficial femoral artery stent on 08/12/2007,  left pseudoaneurysm repair on 08/22/2007.   SINGLE LEVEL ARTERIAL EXAM                               RIGHT              LEFT  Brachial:                    130                137  Anterior tibial:             137                134  Posterior tibial:            143                136  Peroneal:  Ankle/brachial index:        1.04               0.99   PREVIOUS ABI:  Date:  06/04/2008  RIGHT:  1.08  LEFT:  0.99   LOWER EXTREMITY BYPASS GRAFT DUPLEX EXAM:   DUPLEX:  Biphasic Doppler waveforms noted throughout the left common  femoral, superficial femoral and popliteal arteries with mildly  increased velocity of 199 cm/second and 185 cm/second noted at the left  common femoral and mid superficial femoral artery levels, respectively.   IMPRESSION:  1. Patent left superficial femoral artery stent with an increased      velocity noted, as described above.  2. Stable bilateral ankle brachial indices.   ___________________________________________  [name], M.D.   CH/MEDQ  D:  09/10/2008  T:  09/10/2008  Job:  [id number]

## 2010-05-23 NOTE — Cardiovascular Report (Signed)
NAME:  Sean, [name] NO.:  [id number]   MEDICAL RECORD NO.:  [id number]           PATIENT TYPE:   LOCATION:                                 FACILITY:   PHYSICIAN:  [name], M.D.   DATE OF BIRTH:  Feb 19, 1932   DATE OF PROCEDURE:  DATE OF DISCHARGE:                            CARDIAC CATHETERIZATION   INDICATION:  Class III angina, history of previous bypass surgery and  previous bypass graft intervention.   PROCEDURES PERFORMED:  1. Left heart catheterization.  2. Selective coronary angiography.  3. Left ventriculography.  4. Bypass graft angiography.  5. Mammary graft angiography.   DESCRIPTION:  After informed consent, a 6-French sheath was placed in  the right femoral artery.  A 6-French A2 multipurpose catheter was used  for hemodynamic recordings.  Left ventriculography and bypass graft  angiography were performed with this catheter.  We then used a internal  mammary artery catheter for left internal mammary angiography and native  right coronary angiography.  We used #4, 6-French left Judkins catheter  for left coronary angiography.  The patient tolerated the procedure  without complications.   A 1 mg of Versed and 25 mcg of fentanyl were used for sedation.  A 4%  Xylocaine for anesthesia.   RESULTS:  1. Hemodynamic data:      a.     Aortic pressure 100/55.      b.     Left ventricular pressure 155/9.  2. Left ventriculography:  The left ventricle reveals severe inferior      wall hypokinesis.  EF is 55%.  No mitral regurgitation is noted.  3. Coronary angiography:      a.     Left main coronary:  Left main was heavily calcified.  There       is 90-95% ostial stenosis and distal 70-80% stenosis before the       bifurcation.  Calcium is noted throughout the vessel.      b.     Left anterior descending coronary:  Widely patent back to       the left main with reflux of contrast and noncontrasted blood due       to competition from the  patent LIMA.      c.     Circumflex artery:  Two small obtuse marginal branches with       moderate 70-75% stenosis and tandem lesions throughout the       midvessel.      d.     Right coronary:  Totally occluded in the midvessel.  4. Coronary artery bypass grafting:  A:  LIMA to LAD:  Widely patent with reflux of contrast to the ostium of  the LAD.  B.  Saphenous vein graft to the ramus intermedius:  The stented region  is hazy with 50% in-stent restenosis but no high-grade obstruction is  noted.   IMPRESSION:  1. Severe native coronary disease with high-grade left main, moderate      disease in the circumflex, and totally occluded right coronary.  2. Bypass graft failure with occlusion of saphenous vein  graft to the      right coronary and to the diagonal.  3. Patent saphenous vein graft to the ramus without evidence of      significant restenosis at the distal anastomoses to the ramus which      has been previously intervened upon 3 times.  4. Patent left internal mammary artery to the left anterior descending      with reflux of contrast back to the ostium of the left anterior      descending.  5. Angina is likely secondary to diffuse distal disease in the right      coronary territory and distal left anterior descending territory      and could also be related to disease or hypoperfusion in the      circumflex territory since it is dependent on flow through the left      main.  6. Overall normal left ventricular function with inferior wall      aneurysm.   PLAN:  Intensify medical therapy.  Intervention on the left main would  be difficult due to heavy calcification.  This may become necessary if  angina becomes refractory.      [name], M.D.  Electronically Signed     HWS/MEDQ  D:  12/02/2007  T:  12/02/2007  Job:  [id number]   cc:   [name], M.D.  [name]. [name], M.D.

## 2010-05-23 NOTE — H&P (Signed)
[name], [name] NO.:  [id number]   MEDICAL RECORD NO.:  [id number]          PATIENT TYPE:  INP   LOCATION:  1824                         FACILITY:  [facility]   PHYSICIAN:  Jake Bathe, MD      DATE OF BIRTH:  [birth year]   DATE OF ADMISSION:  12/27/2006  DATE OF DISCHARGE:                              HISTORY & PHYSICAL   PRIMARY CARDIOLOGIST:  Lyn Records, M.D.   CHIEF COMPLAINT:  Chest pain.   HISTORY OF PRESENT ILLNESS:  A 77 year old male with coronary artery  disease status post bypass on three separate occasions, last in 1992  with subsequent percutaneous intervention with a Cypher stent to the SVG  to ramus with restenosis and subsequent PCI in 11/07 for in-stent  restenosis witha Taxus by Dr. Katrinka Blazing with LIMA to LAD which was patent,  RCA total occlusion with collateral flow from the LIMA to LAD and mild  LV dysfunction with an ejection fraction of 50% with peripheral vascular  disease status post SFA stent in March of 2007 by Dr. Arbie Cookey with prior  iliac PTCAs with diabetes, hypertension, hyperlipidemia, with an  unrelenting chest pain concerning for unstable angina.  He took several  nitroglycerin at home over the past several days and chest pain has not  been relieved.  It is not clearly exertional.  No associated shortness  of breath, diaphoresis, nausea.  Has had chest pain similar in the past  and has required percutaneous intervention for this angina.   Currently in the emergency department, he is chest pain free after  taking nitroglycerin, here with his wife requesting food.   PAST MEDICAL HISTORY:  1. Coronary anatomy as described above, CABG, Cypher and Taxus to the      SVG to ramus.  Low normal ejection fraction of 50%.  2. Left carotid endarterectomy in 2003.  3. Peripheral vascular disease with SFA stent,  I believe left lower      extremity.  4. Diabetes.  5. Hypertension.  6. Osteopenia.  7. Chronic chest pain.  8.  Anemia.   ALLERGIES:  MORPHINE causes him to be frantic.   MEDICATIONS:  A current list includes:  1. Actos 15 mg twice a day.  2. Glucophage 850 mg twice a day (will hold).  3. Metoprolol 50 mg once a day (most likely long-acting).  4. Felodipine 5 mg once a day.  5. Lipitor 40 mg once a day.  6. Aspirin 325 mg once a day.  7. Actonel 35 mg once a day.  8. Nitroglycerin p.r.n.  9. Plavix 75 mg once a day.   SOCIAL HISTORY:  He is a former smoker 15 years ago.  Denies any alcohol  use.  Is married.   FAMILY HISTORY:  Sister has coronary artery disease, however, there is  no early family history of coronary artery disease.   REVIEW OF SYSTEMS:  No bleeding disorders, no orthopnea, no PND, no  syncope, no palpitations.  Unless specified above, all other 12 review  of systems are negative.   PHYSICAL EXAMINATION:  Blood pressure on arrival was  119/61, pulse 54  and regular, temperature 97.5, respirations 18, sating 99% on room air.  GENERAL:  Alert and oriented times three in no acute distress, pleasant,  here with his wife.  EYES:  Well-perfused conjunctivae, EOMI, no scleral icterus.  NECK:  Left carotid endarterectomy scar noted.  No bruits appreciated.  No thyromegaly.  Normal carotid upstrokes.  CARDIOVASCULAR:  Bradycardic, regular rhythm with no significant  murmurs, rubs or gallops; no JVD.  LUNGS:  Clear to auscultation bilaterally, no rhonchi, normal  respiratory effort.  ABDOMEN:  Soft, nontender, normoactive bowel sounds, no rebound, no  guarding, no bruits appreciated.  EXTREMITIES:  No clubbing, cyanosis or edema.  Difficult to palpate  distal pulses, however, femoral pulses are palpable bilaterally 2+ with  no significant bruit on the right.  There is mild prominence of the left  femoral artery with I do believe prior SFA intervention.  NEUROLOGIC:  Nonfocal, normal gait, no weakness.  SKIN:  Warm, dry and intact, occasional bruise.   DATA:  ECG demonstrates  sinus bradycardia, rate 52 with right bundle  branch block, possible old inferior infarction with small Q-waves in II,  III, and aVF.  Nonspecific ST-T wave changes.  When compared to prior  ECGs dated December 01, 2005, there is no significant change.  Sodium  138, potassium 4.6, BUN 21, creatinine 1.1, glucose 91, hemoglobin 10.5,  hematocrit 31.  Chest x-ray:  Personally viewed showed COPD but no acute  issues.  The first set of  cardiac biomarkers showed a troponin of 0.02,  CK-MB of 1.2, CK of 5.6.  Liver functions are within normal limits,  albumin 3.6.  White count 5.3, platelets 219.  PT 13.1, INR 1.0, PTT 31.   ASSESSMENT/PLAN:  A 77 year old male with coronary artery disease status  post recurrent bypass with most recent percutaneous intervention to the  saphenous vein graft to ramus secondary to in-stent restenosis with  recurrent ongoing chest pain concerning for unstable angina.  1. Unstable angina - will place on heparin drip.  The first set of      cardiac biomarkers are reassuring given the long duration of chest      pain greater than 8 hours.  Will continue to cycle enzymes.  Will      place in stepdown bed.  Will continue metoprolol at short-acting 25      mg b.i.d. given bradycardia.  Will place on nitroglycerin drip to      control angina.  Will also continue Plavix and aspirin.  2. Hyperlipidemia - will continue Lipitor 40 mg once a day.  3. Diabetes mellitus - insulin sliding scale.  Will hold Metformin but      continue Actos 15 mg twice a day.  Will check a hemoglobin A1c.  4. Hypertension - drugs described as above.  Although not on patient's      list currently, previously he was on benazepril 20 mg once a day      and hydrochlorothiazide 25 mg once a day.  Blood pressure      reasonable controlled currently.  May add back at a later time.      Last ejection fraction low normal at 50%.   Will monitor closely.  Try to maintain pain free.  If pain intensifies   or if cardiac biomarkers elevate, will take to the cath lab on an urgent  basis.  At this point, will discuss findings with Dr. Verdis Prime, his  primary cardiologist and will probably proceed  with cardiac  catheterization on Monday following further monitoring.      Jake Bathe, MD  Electronically Signed     MCS/MEDQ  D:  12/27/2006  T:  12/27/2006  Job:  [id number]   cc:   Lyn Records, M.D.

## 2010-05-23 NOTE — Op Note (Signed)
NAMELES, LONGMORE              ACCOUNT NO.:  [id number]   MEDICAL RECORD NO.:  [id number]          PATIENT TYPE:  OIB   LOCATION:  2631                         FACILITY:  [facility]   PHYSICIAN:  Juleen China IV, MDDATE OF BIRTH:  1932/07/31   DATE OF PROCEDURE:  08/12/2007  DATE OF DISCHARGE:                               OPERATIVE REPORT   PREOPERATIVE DIAGNOSIS:  Left leg claudication.   POSTOPERATIVE DIAGNOSIS:  Left leg claudication.   PROCEDURE:  1. Ultrasound access right common femoral artery.  2. Abdominal aortogram.  3. Left lower extremity runoff.  4. Stent left superficial femoral artery.  5. Third order catheterization.   PROCEDURE:  The patient was identified in the holding area and taken to  room 8 where he was placed supine on the table.  Bilateral groins were  prepped and draped in standard sterile fashion.  Time-out was called.  Lidocaine 1% was used for local anesthesia.  The right common femoral  artery was evaluated with ultrasound and found to be widely patent.  It  was accessed with an 18-gauge needle under ultrasound guidance.  A  Bentson wire was advanced into the iliac arterial system where it met  resistance.  A 5-French sheath was placed.  Retrograde contrast  injection revealed the pathway into the abdominal aorta.  Bentson wire  was then advanced under fluoroscopic visualization of the aorta.  Omniflush catheter was placed above L1 and abdominal aortogram was  obtained.  Next, using Omniflush catheter and Glidewire, aortic  bifurcation was crossed.  Contrast injections were then performed into  the left leg through an end-hole catheter.   FINDINGS:  Aortogram:  Visualized portion of the suprarenal abdominal  aorta showed minimal disease.  There are single renal arteries  identified bilaterally.  They are patent but small.  There is minimal  disease of the infrarenal abdominal aorta, however it is ectatic.  Bilateral common iliac arteries are  patent.  Stent and bilateral iliac  arteries were patent.  The bilateral external iliac arteries were  heavily calcified.   Left lower extremity:  The left common femoral artery is patent.  The  left profunda femoral artery is patent.  The left superficial femoral  artery is patent at its proximal extent.  There is mild luminal  narrowing at its origin.  At the level of the adductor hiatus, there is  diffuse disease throughout its course with multiple areas of high-grade  stenoses extending into the proximal popliteal artery.  Popliteal artery  is patent down across the knee.  Proximally, the patient has a patent  anterior tibioperoneal and posterior tibial artery.  However, the  posterior tibial artery occludes and reconstitutes in the distal foot.  Due to contrast timing, it was difficult to evaluate flow into the foot.   Intervention:  After the above images were obtained, decision was made  to intervene.  Over a Rosen wire, a 7-French sheath was placed.  The  patient was given systemic heparinization.  Using a Glidewire across the  catheter, the lesion in the superficial femoral, and popliteal artery  was crossed.  Contrast injection was performed to ensure that I was  still intraluminal.  Once this was done, a Rosen wire was placed through  the Quick-Cross catheter.  I elected to primary stent the lesion.  A  self-expanding Absolute 7 x 80 stent was deployed in the proximal  popliteal and distal superficial femoral artery.  It was molded to  confirmation with a 6-mm balloon.  Followup images were obtained which  revealed similar runoff to pre-intervention.  At this point, decision  was made to terminate the  procedure.  Catheters and wires were removed.  The sheath was pulled back to the right external iliac artery.  The  patient was taken to the holding area for sheath pull once his  coagulation profile corrects.   IMPRESSION:  High-grade stenosis of the left superficial femoral  and  proximal popliteal artery successfully treated with a 7 x 80 Absolute  self-expanding stent.           ______________________________  V. Charlena Cross, MD  Electronically Signed     VWB/MEDQ  D:  08/12/2007  T:  08/12/2007  Job:  [id number]

## 2010-05-23 NOTE — Assessment & Plan Note (Signed)
OFFICE VISIT   [name], MESSMER  DOB:  26-Jun-1932                                       02/27/2008  ZOXWR#:[id number]   Patient presents today for followup of his diffuse peripheral vascular  occlusive disease.  He had noninvasive vascular laboratory studies in  our office on 02/16/08, and he is here today for discussion of this.  He  has had staged bilateral carotid endarterectomies in the past and also  had a recent superficial femoral artery angioplasty and stenting by Dr.  Myra Gianotti on 08/12/07.  He reports bilateral nonlimiting calf  claudication.  He denies any neurologic deficits.  He reports that he is  having ongoing severe coronary disease with recent catheterization,  according to patient, showing diffuse severe disease that was not  correctable.   PHYSICAL EXAMINATION:  Blood pressure 164/77, pulse 60, respirations 18.  His carotid arteries had well-healed incisions without bruits  bilaterally.  He has 2+ radial pulses bilaterally.  He does have  palpable femorals and 1-2+ dorsalis pedis pulses bilaterally.  His feet  are well perfused without lesions.   He underwent noninvasive vascular laboratory studies in our office on  02/16/08.  His carotid duplex revealed widely patent endarterectomies  bilaterally with no evidence of stenosis.  His lower extremity studies  revealed an ankle-arm index of 0.98 on the right and 0.79 on the left.  His previous study in 11/09 showed 0.95 on the left.   I discussed this with the patient.  He is having tolerable claudication  and will continue his walking program.  We will continue to follow him  in the noninvasive lab, per protocol.   [name], M.D.  Electronically Signed   TFE/MEDQ  D:  02/27/2008  T:  02/27/2008  Job:  [id number]   cc:   Lyn Records, M.D.  Thora Lance, M.D.

## 2010-05-23 NOTE — Assessment & Plan Note (Signed)
OFFICE VISIT   Sean, [name]  DOB:  [birth year]                                       06/17/2006  ZOXWR#:[id number]   The patient presents today for followup of diffuse peripheral vascular  occlusive disease. He most recently underwent a right superficial  femoral artery cannulization and stenting by me on March 21, 2005. He  had prior bilateral common iliac artery PTA's. He reports that he is  having some recurrence of leg pain with walking, but attributes this to  anemia and leg swelling. He is undergoing current evaluation for a  anemia, apparently is having a difficult time with finding the etiology  of this. He does have high blood pressure and elevated cholesterol and  is diabetic non-insulin dependent. He does not smoke, having quit in  2000.   Multiple positive review of systems including reflux, hiatal hernia, and  anemia.   PHYSICAL EXAMINATION:  Well-developed, well-nourished, white male,  appearing his stated age of 56. Blood pressure is 124/66, pulse 61,  respirations 18. His radial pulses are 2+ bilaterally. He has normal  carotid pulsation bilaterally with no evidence of bruits bilaterally.  His femoral pulses are 2+. He has 2+ popliteal pulses. He has a 2+ right  posterior tibial pulse and absent tibial pulses on the left. He does  have marked pitting edema bilaterally. He underwent non-invasive  vascular laboratory studies in our office revealing good waveform  bilaterally. AB  index was 0.98 on the right and 0.68 on the left.   I am quite pleased with his continued patency of his right SFA stent. He  will continue his walking program and he is to follow up with Dr.  Valentina Lucks regarding anemia and Dr.  Katrinka Blazing regarding his cardiac status and  fluid overload. We will see him in our vascular lab per protocol.   [name], M.D.  Electronically Signed   TFE/MEDQ  D:  06/17/2006  T:  06/17/2006  Job:  [id number]   cc:   [name],  M.D.  [name], M.D.

## 2010-05-23 NOTE — Procedures (Signed)
LOWER EXTREMITY ARTERIAL DUPLEX   INDICATION:  Left superficial femoral artery stent.   HISTORY:  Diabetes:  Yes.  Cardiac:  CABG and MI.  Hypertension:  Yes.  Smoking:  Previous.  Previous Surgery:  Left superficial femoral artery stent 08/12/07 and a  left pseudoaneurysm repair, 08/22/07.   SINGLE LEVEL ARTERIAL EXAM                          RIGHT                LEFT  Brachial:               132                  141  Anterior tibial:        137                  127  Posterior tibial:       142                  128  Peroneal:  Ankle/Brachial Index:   1.01                 0.91   LOWER EXTREMITY ARTERIAL DUPLEX EXAM   PREVIOUS ABI:  Date:  09/10/2008  Right:  1.04  Left:  0.99   DUPLEX:  Biphasic waveforms throughout the left lower extremity with  elevated velocities within the distal external iliac artery at 1.77 m/s.  Increased velocities within the stent at distal leg at 2.17 m/s.   IMPRESSION:  1. Stable ankle brachial indices.  2. Patent left superficial femoral artery stent with increased      velocities at the distal external iliac artery and within the      stent, as described above.   ___________________________________________  [name], M.D.   OD/MEDQ  D:  12/20/2009  T:  12/20/2009  Job:  [id number]

## 2010-05-23 NOTE — Procedures (Signed)
BYPASS GRAFT EVALUATION   INDICATION:  Followup left lower extremity stent with increased left  lower extremity claudication.   HISTORY:  Diabetes:  Yes.  Cardiac:  CABG, MI.  Hypertension:  Yes.  Smoking:  Previous.  Previous Surgery:  Left SFA stent 08/12/2007, left pseudoaneurysm repair  08/22/2007.   SINGLE LEVEL ARTERIAL EXAM                               RIGHT              LEFT  Brachial:                    119                131  Anterior tibial:             130                130  Posterior tibial:            141                127  Peroneal:  Ankle/brachial index:        1.08               0.99   PREVIOUS ABI:  Date:  02/16/2008  RIGHT:  1.0  LEFT:  0.79   LOWER EXTREMITY BYPASS GRAFT DUPLEX EXAM:   DUPLEX:  Doppler arterial waveforms appear biphasic throughout left  lower extremity.  Elevated velocities in left CFA of 269 cm/s appear lower than previous  and more consistent with study before that.  Elevated velocities in left mid SFA within stent of 231 cm/s suggestive  of >50%.   IMPRESSION:  1. Right ABI appears stable.  2. Left ABI shows increase from previous study, consistent with study      before that.  3. Patent left SFA stent with elevated velocities suggestive of >50%.  4. Fairly stable left common femoral artery elevated velocities.   ___________________________________________  [name], M.D.   AS/MEDQ  D:  06/04/2008  T:  06/04/2008  Job:  [id number]

## 2010-05-23 NOTE — Discharge Summary (Signed)
NAMEANGAD, NABERS NO.:  [id number]   MEDICAL RECORD NO.:  [id number]          PATIENT TYPE:  INP   LOCATION:  6533                         FACILITY:  [facility]   PHYSICIAN:  Lyn Records, M.D.   DATE OF BIRTH:  12/30/1932   DATE OF ADMISSION:  12/27/2006  DATE OF DISCHARGE:  12/31/2006                               DISCHARGE SUMMARY   DISCHARGE DIAGNOSES:  1. Chest pain, resolved.  2. Coronary artery disease with history of coronary artery bypass      grafting (three separate occasions).  3. Diabetes mellitus.  4. Hypertension.  5. Hyperlipidemia.  6. Longterm medication use.   HOSPITAL COURSE:  The patient is a 77 year old male patient who was  admitted to [facility] on December 27, 2006, with unstable  angina.  He ruled out for myocardial infarction by enzymes.  He  ultimately underwent cardiac catheterization and was found to have focal  in-stent restenosis of the saphenous vein graft to the ramus  intermedius.  Angioplasty was completed and a less than 25% stenosis  remained with TIMI 3 flow.  The patient tolerated the procedure well,  remained in the hospital overnight, and was ready for discharge to home  the following day.   Upon discharge, his vitals were stable, temperature 97.4, pulse 56,  respirations 20, blood pressure 163/44, hemoglobin 8.7, hematocrit 25.3  (the patient has longterm anemia treated by Jillyn Hidden B. Sherrill, M.D.).  BUN 22, creatinine 1.36.   DISCHARGE MEDICATIONS:  Unchanged from his home medications.  1. Lipitor 40 mg daily.  2. Plavix 75 mg daily.  3. Metoprolol 50 mg daily.  4. Felodipine 5 mg a day.  5. Enteric-coated aspirin 325 mg daily.  6. Actonel as before.  7. All vitamins as before.  8. Hydrochlorothiazide 25 mg daily.  9. Benazepril 20 mg a day.  10.Sublingual nitroglycerin p.r.n. chest pain.   He is asked to resume his Actomet on January 02, 2007.   Follow up with Dr. Truett Perna for his Aranesp  injection on January 01, 2007, at 10:45 a.m.  Follow up with Dr. Katrinka Blazing on January 14, 2007, at 3  p.m.   Clean the catheterization site gently with soap and water.  No  scrubbing.  No lifting over 10 pounds for one week, no driving for two  days.  Remain on a diabetic diet.  Call for any questions or concerns.      Guy Franco, P.A.      Lyn Records, M.D.  Electronically Signed    LB/MEDQ  D:  12/31/2006  T:  12/31/2006  Job:  [id number]   cc:   Leighton Roach. Truett Perna, M.D.

## 2010-05-23 NOTE — Op Note (Signed)
[name], [name]              ACCOUNT NO.:  [id number]   MEDICAL RECORD NO.:  [id number]          PATIENT TYPE:  AMB   LOCATION:  ENDO                         FACILITY:  [facility]   PHYSICIAN:  Bernette Redbird, M.D.   DATE OF BIRTH:  12-15-1932   DATE OF PROCEDURE:  01/15/2008  DATE OF DISCHARGE:                               OPERATIVE REPORT   PROCEDURE:  Upper endoscopy.   INDICATIONS:  A 77 year old gentleman with nausea and reflux symptoms.   FINDINGS:  Moderately severe erosive esophagitis involving the distal  third of the esophagus.  Small hiatal hernia.  Minimal gastritis.   DESCRIPTION OF PROCEDURE:  The nature, purpose, and risks of the  procedure had been discussed with the patient who provided written  consent who came as an outpatient to the [facility].  Chloraseptic gargle was followed by fentanyl 25 mcg and Versed 4 mg IV  for sedation prior to and during the course of the procedure without  clinical instability.  The Pentax video endoscope was passed under  direct vision.  The vocal cords looked normal.   The esophagus was entered without undue difficulty.  The proximal  esophagus was normal but beginning in roughly the midpoint of the  esophagus, there were linear and somewhat serpiginous areas of erythema  and exudate consistent with erosions due to reflux esophagitis.  Distally, these were almost circumferential.  The erythematous base in  these areas may represent Barrett esophagus but I elected not to biopsy  it since this patient is not a candidate for Barrett's therapy given his  significant medical comorbidities.   The stomach contained no significant residual.  There were a few  punctate mucosal hemorrhages in the prepyloric region but no erosions,  ulcers, polyps or masses were noted anywhere in the stomach including a  retroflexed view of the cardia which showed a slightly patulous  diaphragmatic hiatus.  The pylorus, duodenal bulb and  second duodenum  looked normal.   The patient tolerated the procedure fairly well and there were no  apparent complications.   IMPRESSION:  Erosive reflux esophagitis.   PLAN:  1. Review anti-peptic therapy and consider addition or intensification      of current medication, keeping in mind that the patient is on      Plavix so Protonix is probably the only PPI he will be able to use.  2. Proceed to colonoscopic evaluation because of a history of blood in      the stool.           ______________________________  Bernette Redbird, M.D.     RB/MEDQ  D:  01/15/2008  T:  01/16/2008  Job:  [id number]   cc:   Thora Lance, M.D.  Lyn Records, M.D.

## 2010-05-23 NOTE — Assessment & Plan Note (Signed)
OFFICE VISIT   JAMESROBERT, OHANESIAN  DOB:  1932/07/02                                       08/29/2007  BMWUX#:[id number]   Patient presents today with concern regarding discoloration and swelling  following his recent surgery.  He underwent arteriography on 08/13 and  underwent repair of a false aneurysm at his left groin on 08/14.  He  initially had significant swelling in his legs and marked discoloration  in his scrotum and called and asked to be seen.  He reports that over  the last 24 hours this has markedly improved.   PHYSICAL EXAMINATION:  He does have 2+ popliteal pulses bilaterally.  He  does have some ecchymosis of his scrotum, left groin, and buttock.  His  wound looks quite good with no evidence of healing difficulty, and he  does have the usual amount of para incisional swelling.   I reassured that patient and his wife of this, and he will continue his  walking program and will see Dr. Myra Gianotti, as scheduled, in three weeks  for followup.   [name], M.D.  Electronically Signed   TFE/MEDQ  D:  08/29/2007  T:  08/29/2007  Job:  [id number]   cc:   [name], MD

## 2010-05-23 NOTE — Procedures (Signed)
CAROTID DUPLEX EXAM   INDICATION:  Follow-up evaluation of known carotid artery disease.   HISTORY:  Diabetes:  Orally controlled.  Cardiac:  Coronary artery bypass graft in 1997.  Hypertension:  Yes.  Smoking:  Former smoker.  Previous Surgery:  Right carotid endarterectomy with Dacron patch  angioplasty on 05/25/2003.  Left carotid endarterectomy with Dacron  patch angioplasty on 06/23/2001, both by Dr. Arbie Cookey.  CV History:  Previous duplex on 06/15/04 revealed no ICA stenosis  bilaterally, status post endarterectomy.  Amaurosis Fugax No, Paresthesias No, Hemiparesis No.                                       RIGHT             LEFT  Brachial systolic pressure:         124               138  Brachial Doppler waveforms:         Biphasic          Triphasic  Vertebral direction of flow:        To and fro        Antegrade  DUPLEX VELOCITIES (cm/sec)  CCA peak systolic                   84                60  ECA peak systolic                   74                89  ICA peak systolic                   43                57  ICA end diastolic                   9                 16  PLAQUE MORPHOLOGY:                  Intimal thickening                  Intimal thickening  PLAQUE AMOUNT:                      Minimal           Mild  PLAQUE LOCATION:                    Distal CCA        Distal CCA   IMPRESSION:  1. 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  2. No internal carotid artery stenosis, status post endarterectomy      bilaterally.   ___________________________________________  V. Charlena Cross, MD   MC/MEDQ  D:  02/16/2008  T:  02/16/2008  Job:  [id number]

## 2010-05-23 NOTE — Assessment & Plan Note (Signed)
OFFICE VISIT   [name], LARGO  DOB:  05-Mar-1932                                       09/22/2007  VWUJW#:[id number]   REASON FOR VISIT:  Followup.   HISTORY:  This is a 77 year old gentleman who underwent stenting of his  left superficial femoral artery on 08/12/2007.  He also had a diagnostic  arteriogram of his right leg, on 08/13.  This was complicated by left  groin pseudoaneurysm which required operative repair.  He comes back in  today for followup.  He did see Dr. Arbie [name] on 08/21 regarding some  discoloration and swelling.  At that time, this looked to be typical  postoperative changes.  He comes back in today.  He does not have any  evidence of claudication at this time.  His pain is improving.  However,  he does complain of 2 specific areas around his incision that are  bothering him.  He has not been having any fevers or chills.  There has  been no drainage.   EXAMINATION:  Blood pressure is 141/61, pulse is 56, respirations 16.  He is well appearing in no distress.  He has easily palpable pulses.  No  ulceration.  On exam, ankle brachial index on the right is 1.14, on the  left is 1.06.   ASSESSMENT/PLAN:  Status post stenting of the left superficial femoral  artery.  He also has a repair of pseudoaneurysm left groin.   PLAN:  The patient will be placed on our ultrasound surveillance  protocol.  He will follow up in 3 months.  He is to continue his aspirin  and Plavix.   Sean Ny, MD  Electronically Signed   VWB/MEDQ  D:  09/22/2007  T:  09/24/2007  Job:  [id number]   cc:   [name], M.D.

## 2010-05-23 NOTE — Assessment & Plan Note (Signed)
OFFICE VISIT   [name], [name]  DOB:  12/29/1932                                       07/25/2007  YNWGN#:[id number]   The patient presents today for continued follow-up of his diffuse  peripheral vascular occlusive disease.  He has had progressive  claudication symptoms, much more severe on the left than on the right.  He denies any resting discomfort.  He has tolerable symptoms when  walking on a flat surface, but on any incline has immediate severe  claudication symptoms.  If he progresses, he can have some discomfort in  his thigh as well.  He has mild symptoms in his right leg.  He is status  post bilateral common iliac artery stenting in June of 2005 and had  right distal superficial femoral artery recanalization and stenting in  2007.  He is also status post bilateral carotid endarterectomies in the  distant past.  He does have severe coronary disease, having undergone  prior percutaneous treatment by Dr. Garnette Scheuermann.  He has had coronary  bypass grafting in 1979, 1984 and 1993.   PHYSICAL EXAM:  A well-developed, well-nourished white male appearing  stated age 77.  He does have palpable femoral pulses bilaterally and  does have 1 to 2+ popliteal pulses bilaterally, he has absent distal  pulses bilaterally.  He does not have any tissue loss in his lower  extremities.  Ankle arm index today is 0.93 on the right and 0.77 on the  left.   I had a long discussion with Mr. & Mrs. Norrington.  He is severely limited  by his claudication and I have recommended that we repeat his  arteriography to determine if he has a lesion amenable to angioplasty.  He is scheduled for arteriography by Dr. Durene Cal August 4th.  We  will make further recommendations pending this study.   [name], M.D.  Electronically Signed   TFE/MEDQ  D:  07/25/2007  T:  07/28/2007  Job:  [id number]   cc:   Lyn Records, M.D.  Thora Lance, M.D.

## 2010-05-23 NOTE — Op Note (Signed)
NAMERAMONA, SLINGER              ACCOUNT NO.:  [id number]   MEDICAL RECORD NO.:  [id number]          PATIENT TYPE:  INP   LOCATION:  2033                         FACILITY:  [facility]   PHYSICIAN:  Juleen China IV, MDDATE OF BIRTH:  January 22, 1932   DATE OF PROCEDURE:  08/22/2007  DATE OF DISCHARGE:                               OPERATIVE REPORT   PREOPERATIVE DIAGNOSIS:  Left groin pseudoaneurysm.   POSTOPERATIVE DIAGNOSIS:  Left groin pseudoaneurysm.   PROCEDURES PERFORMED:  Repair of left groin pseudoaneurysm.   ANESTHESIA:  General.   DRAINS:  15 Blake.   SPECIMENS:  Sac.   BLOOD LOSS:  Minimal.   SURGEON:  1. Durene Cal IV, MD   INDICATIONS:  This is a 77 year old gentleman who underwent lower  extremity arteriogram the day prior.  In the recovery room, he was found  to have a bulge in his left groin which he initially thought was similar  in appearance to a bulge that had been there from catheterization 2  years ago.  Ultrasound confirmed the pseudoaneurysm.  He was kept  overnight.  CT angiogram was performed the following day, which revealed  large pseudoaneurysm originating at the bifurcation of the common  femoral artery.  The patient became increasingly tender and therefore a  decision was made to take him to the operating room for repair.  Risks  and benefits were discussed.  Informed consent was signed.   PROCEDURE:  The patient was identified in the holding area and taken to  room 6.  He was placed supine on the table.  General endotracheal  anesthesia was administered.  The patient was prepped and draped in  standard sterile fashion.  A time-out was called.  Antibiotics were  given.  A longitudinal incision was made in the left groin.  Dissection  was initially performed lateral to the pseudoaneurysm.  I first began  exposing the superficial femoral and profunda femoral artery, remote  from the pseudoaneurysm.  Each of these vessels were individually  isolated and encircled with the vessel loop.  Next, proximal control was  obtained.  I dissected around the pseudoaneurysm at the level of the  inguinal ligament.  There was chronic or old appearing congealed  thrombus in this area suggesting that part of this process was not  acute.  I was able to get down to the distal external iliac artery.  I  was able to perform manual compression for proximal control.  Once this  was obtained, I decided to enter the aneurysm sac itself.  There was  postop bleeding which was immediately controlled with digital pressure.  There was a large sac.  An additional process was involved as it did not  appear acute.  There was a hernia sac like structure with an old  thrombus contained within it.  This was resected with cautery and sent  as a specimen to pathology.  Next, manual compression on the artery was  obtained and the hole in the artery was closed primarily with a single 5-  0 Prolene suture.  Once this was done, hemostasis was obtained.  I then  further explored the wound.  I did not see evidence of a hernia which  the patient had complained of prior to his catheterization.  The  inguinal ligament was intact; however, there was scanty bleeding into  the retroperitoneum that appeared to be more chronic than acute.  Once  the thrombus was evacuated, the wound was then copiously irrigated.  I  had to reapproximate and close down the space under the inguinal  ligament.  The tissue of the artery was closed with a 2-0 Vicryl.  Subcutaneous tissue was closed with 3-0 Vicryl.  The skin was closed  with 4-0 Vicryl and Dermabond was placed.  I placed at 15-Blake drain 1  layer above the artery.  This was sutured into place with 3-0 nylon.  The patient was then awakened from anesthesia and taken to recovery room  in stable condition.  There were no complications.           ______________________________  V. Charlena Cross, MD  Electronically Signed      VWB/MEDQ  D:  08/22/2007  T:  08/23/2007  Job:  [id number]

## 2010-05-23 NOTE — Op Note (Signed)
NAMEGARTH, [name]              ACCOUNT NO.:  [id number]   MEDICAL RECORD NO.:  [id number]          PATIENT TYPE:  OIB   LOCATION:  2033                         FACILITY:  [facility]   PHYSICIAN:  Juleen China IV, MDDATE OF BIRTH:  26-Jan-1932   DATE OF PROCEDURE:  08/21/2007  DATE OF DISCHARGE:                               OPERATIVE REPORT   PREOPERATIVE DIAGNOSIS:  Right leg claudication.   POSTOPERATIVE DIAGNOSIS:  Right leg claudication.   PROCEDURES:  1. Ultrasound access, left common femoral artery.  2. Second-order catheterization.  3. Right lower extremity runoff.   PROCEDURE:  The patient was identified in the holding area and taken to  the room where she was placed supine on table. Bilateral groins were  prepped and draped in a standard sterile fashion. A time-out was called.  The left common femoral artery was evaluated with ultrasound and was  found to be capsular posterior plaque in the left common femoral artery.  1% lidocaine was used for local anesthesia.  Left common femoral artery  was accessed under ultrasound guidance.  An 0.035 wire was advanced into  the iliac arterial system and there was mild amount of resistance.  A 5-  French sheath was not advanced; and therefore, a 6-French dilator was  placed followed by the 5-French sheath.  This was done over [employer] wire.  Next then, a Kumpe catheter was used to select and wire access to the  aorta.  This was followed by an Omni flush catheter.  Using an Omni  flush catheter and Bentson wire, the aortic bifurcation was crossed and  catheter was placed in the right external iliac artery and right lower  extremity runoff was obtained.   FINDINGS:  The right common femoral artery is visualized and widely  patent.  The right profunda femoral artery is patent.  The right  superficial femoral artery is patent.  The stent was within the distal  right superficial femoral artery.  There is no evidence of in-stent  stenosis.  The right popliteal artery is patent.  There is three-vessel  runoff.   After the above findings were obtained, decision was made not to  intervene at this time.  The catheters and wires were removed.  The  patient was taken to the holding area after the sheath pulled.  There  were no complications.   IMPRESSION:  No evidence of in-stent stenosis on the right.           ______________________________  V. Charlena Cross, MD  Electronically Signed     VWB/MEDQ  D:  08/21/2007  T:  08/22/2007  Job:  [id number]

## 2010-05-23 NOTE — Cardiovascular Report (Signed)
NAMESHREYAS, PIATKOWSKI NO.:  [id number]   MEDICAL RECORD NO.:  [id number]          PATIENT TYPE:  INP   LOCATION:  6533                         FACILITY:  [facility]   PHYSICIAN:  Lyn Records, M.D.   DATE OF BIRTH:  20-Feb-1932   DATE OF PROCEDURE:  12/27/2006  DATE OF DISCHARGE:                            CARDIAC CATHETERIZATION   INDICATION FOR PROCEDURE:  Unstable angina.   PROCEDURES PERFORMED:  1. Left heart catheterization.  2. Selective coronary angiography.  3. Left ventriculography.  4. Saphenous vein graft angiography.  5. Internal mammary graft angiography.  6. AngioSculpt balloon angioplasty of in-stent restenosis in the      distal saphenous vein graft to the ramus.   DESCRIPTION:  The patient was brought to cath lab after having been  admitted to the hospital with unstable angina.  There is a prior history  of Cypher stent implantation and distal graft to the ramus intermedius  branch.  This is a 3.0 Cypher stent placed in 2006.  He presented  approximately a year later with restenosis, focal at the transition  point from saphenous vein graft to the ramus.  This was restented with a  TAXUS DES.  A 16 atmospheres pressure postdilatation with a 3.5 balloon  was performed at that time.  On this occasion, he represents with  symptoms occurring at rest and has focal in-stent restenosis within  overlapping stent sandwich at the junction between the saphenous vein  graft and the native vessel.  The anatomy otherwise is unremarkable when  compared to the prior study including a patent LIMA to the LAD,  otherwise patent saphenous vein graft to the ramus, totally occluded  native right coronary, and high-grade obstruction of the distal left  main supplying the LAD and residual circumflex but with retrograde flow  from the LIMA through the LAD to the circumflex.   After several images of the saphenous vein graft to the ramus, we  decided to perform PCI.  We  used an Amplatz left 1 guide catheter, ASAHI  Prowater guidewire, Angiomax bolus at 0.75 mg/kg over 2 minutes.  This  was followed by an infusion of 1.75 mg/kg per hour.  ACT was documented  be greater than 300.  The patient has been on Plavix continuously.   ASAHI Prowater 300-cm long wire was placed down the graft into the  distal ramus intermedius branch.  We then performed angioplasty using a  20-mm long x 3.5-mm diameter AngioSculpt balloon within the stented  region.  Peak pressure was 12 atmospheres which was an effective  diameter of 3.7.  We then postdilated with a 3.5-mm x 10-mm long Dura  Star balloon to 17 and 18 atmospheres in the focal area of restenosis  after the AngioSculpt balloon.  Both balloons had nice expansion without  a waste noted.  TIMI grade 3 flow was noted postdilatation.  Each  balloon inflation was longer than 30 seconds reproducing chest  discomfort that led to the patient's hospitalization.   The case was terminated.  The patient was taken to the holding area.   RESULTS:  1.  Hemodynamic data:      a.     Aortic pressure 161/54.      b.     Left ventricular pressure 161/12.  2. Left ventriculography:  There is inferior wall hypokinesis.  EF is      50%.  3. Coronary angiography:      a.     Left main coronary:  Ostial 80% and distal 90% stenosis.      b.     Left anterior descending coronary:  Proximal and mid 70-80%       stenoses with competitive flow noted in the mid vessel and       diagonals.      c.     Circumflex artery:  The circumflex contains proximal and mid       50% stenoses but with brisk flow.      d.     Ramus branch:  Totally occluded distal to the left main.      e.     Right coronary:  Totally occluded in the mid vessel.  4. Bypass graft angiography.      a.     Saphenous vein graft to the ramus:  This graft is patent.       The mid graft contains ectatic ulcerative plaque obstructing the       vessel by 20-30%.  The overlapping  stents in the distal graft to       native vessel junction contains a very focal region of 95% in-       stent restenosis.  Distal vessels are widely patent.  5. Left internal mammary graft to the diagonal:  Widely patent with      reflux with contrast into the proximal LAD and down into the      circumflex.  6. PCI of in-stent restenosis:  The stenosis was 95% reduced to less      than 20% after the AngioSculpt balloon followed by high pressure      noncompliant balloon inflation in the saphenous vein graft/native      vessel bifurcation region of in-stent restenosis.  TIMI grade 3      flow was noted.  Residual stenosis was less than 25%.   CONCLUSIONS:  1. Patent left internal mammary artery to the left anterior      descending.  2. Patent saphenous vein graft to the ramus but with in-stent      restenosis distally as described above.  3. Severe native vessel disease with total occlusion of the mid-right      high-grade obstruction left main, both ostial and distal.  4. Patent distal circumflex.  5. Total occlusion of ramus intermedius.  6. Moderate disease in the proximal and mid left anterior descending.  7. Low normal left ventricular function with inferior severe      hypokinesis and ejection fraction of 50%.  8. Successful AngioScore balloon angioplasty and high-pressure balloon      angioplasty on in-stent restenosis in the distal saphenous vein      graft to the ramus  9. Widely patent left internal mammary artery to the left anterior      descending.   PLAN:  Aspirin, Plavix.  High risk for recurrent stenosis.  Chose not to  place an additional stent at this time, may be required on next visit,  perhaps with a stent that has a high scaffolding potential.      Lyn Records, M.D.  Electronically Signed  HWS/MEDQ  D:  12/30/2006  T:  12/30/2006  Job:  [id number]   cc:   Hal T. Stoneking, M.D.  Candyce Churn, M.D.  [name]. Coral Spikes, M.D.

## 2010-05-23 NOTE — Procedures (Signed)
BYPASS GRAFT EVALUATION   INDICATION:  Follow-up left lower extremity stent.   HISTORY:  Diabetes:  Yes.  Cardiac:  MI.  Hypertension:  Yes.  Smoking:  Previous.  Previous Surgery:  Left superficial femoral artery stent on 08/12/2007,  pseudoaneurysm repair on 08/22/2007.   SINGLE LEVEL ARTERIAL EXAM                               RIGHT              LEFT  Brachial:                    122                130  Anterior tibial:             122                118  Posterior tibial:            128                124  Peroneal:  Ankle/brachial index:        0.98               0.95   PREVIOUS ABI:  Date: 09/22/2007  RIGHT:  1.1  LEFT:  1.06   LOWER EXTREMITY BYPASS GRAFT DUPLEX EXAM:   DUPLEX:  Biphasic Doppler waveforms noted throughout the left common  femoral, superficial femoral, and popliteal arteries with no evidence of  significant stenosis noted.   IMPRESSION:  1. Patent left superficial femoral artery stent with no evidence of      stenosis.  2. Stable bilateral ankle brachial indices noted.    ___________________________________________  V. Charlena Cross, MD   CH/MEDQ  D:  11/17/2007  T:  11/17/2007  Job:  [id number]

## 2010-05-23 NOTE — Assessment & Plan Note (Signed)
OFFICE VISIT   SWANSON, FARNELL  DOB:  09-23-1932                                       01/31/2007  TGGYI#:[id number]   The patient presents today for continued followup of his lower extremity  arterial insufficiency.  He has had bilateral iliac angioplasties and  had a right SFA recannulization and stenting in March 2007.  He reports  mild to moderate calf claudication that is worse with walking uphill.  He had a recent episode of severe chest pain and had re-intervention  with Dr. Katrinka Blazing for re-angioplasty in a prior stented area in his  coronaries.  He does not have any rest pain.  His main concern is that  he falls and he measured low blood pressure at home.  He will discuss  this with Dr. Valentina Lucks to determine if he can have a reduction in his  anti-hypertensive regimen.   Today's blood pressure is 138/69, pulse 67, respirations 18, 96%  saturation on room air.  His physical exam, he has 2+ femoral, 2+  popliteal pulses.  I do not feel distal pulses.  His ankle/arm indexes  are normal bilaterally at 1.1 on the right and 0.96 on the left.  I  discussed this with the patient.  I would not recommend any further  evaluation for his mild to moderate claudication.  He will continue to  be followed up in our vascular lab.   [name], M.D.  Electronically Signed   TFE/MEDQ  D:  01/31/2007  T:  02/03/2007  Job:  [id number]   cc:   Lyn Records, M.D.  Thora Lance, M.D.

## 2010-05-23 NOTE — Op Note (Signed)
NAMEASHDON, GILLSON              ACCOUNT NO.:  [id number]   MEDICAL RECORD NO.:  [id number]          PATIENT TYPE:  AMB   LOCATION:  ENDO                         FACILITY:  [facility]   PHYSICIAN:  Bernette Redbird, M.D.   DATE OF BIRTH:  1932-11-12   DATE OF PROCEDURE:  01/15/2008  DATE OF DISCHARGE:                               OPERATIVE REPORT   PROCEDURE:  Colonoscopy with biopsy.   INDICATIONS:  A 77 year old with hematochezia.   FINDINGS:  Several diminutive polyps.  Left-sided diverticulosis.   DESCRIPTION OF PROCEDURE:  The nature, purpose, and risks of the  procedure had been discussed with the patient, who provided written  consent.  This procedure was done immediately following his upper  endoscopy.  He had come as an outpatient to the [facility].  The procedure was done at [facility] in view of his history of  daily angina requiring nitroglycerin, in case he were to have some sort  of cardiac problem during the exam, but fortunately none occurred.   Sedation totalled fentanyl 50 mcg and Versed 6.5 mg for this procedure  and the upper endoscopy which preceded it.   The Pentax pediatric video colonoscope was inserted following an  unremarkable digital exam of the prostate gland.   The scope was advanced with moderate difficulty through a somewhat  tortuous colon with a lot of spasm and diverticular change in the  sigmoid colon region.  We ultimately reached the cecum and in fact  entered the terminal ileum for a short distance.  It appeared normal.  Pullback was then performed.  The quality of the prep was very good, and  it was felt that all areas were well seen.   In the ascending colon, there were one or two small sessile polyps  removed by cold biopsy, and the same is true in the descending colon.  In the rectum was a slightly larger roughly 4-mm sessile polyp removed  by several cold biopsies.  It oozed for several minutes, consistent with  the  patient being on aspirin and Plavix, so we watched it, and it had  slowed to a very minimal trickle by the time we stopped observing.   Retroflexion in the rectum was normal except for a tiny bit of contact  hemorrhage consistent with mucosal fragility, although no evident  proctitis was seen.   No cancer, large polyps, colitis, or vascular ectasia were noted.  Pullout through the anal canal did not demonstrate particularly enlarged  or excoriated hemorrhoids.   The patient tolerated this procedure well, and there were no apparent  complications.   IMPRESSION.:  1. Multiple diminutive and small polyps removed by cold biopsy      technique as described above.  2. Left side diverticulosis.  3. No definite source of hematochezia identified.   PLAN:  Await polyp pathology.  However, given this patient's significant  medical comorbidities, most especially his untreatable coronary disease  status post 3 previous bypass operations, I do not think that routine  surveillance colonoscopies will be necessary since no major lesions were  identified at this  time, and the risk of doing a subsequent procedure  several years from now would probably outweigh the benefit.           ______________________________  Bernette Redbird, M.D.     RB/MEDQ  D:  01/15/2008  T:  01/16/2008  Job:  [id number]   cc:   Thora Lance, M.D.  Lyn Records, M.D.

## 2010-05-23 NOTE — Discharge Summary (Signed)
NAMERYNE, MCTIGUE NO.:  [id number]   MEDICAL RECORD NO.:  [id number]          PATIENT TYPE:  INP   LOCATION:  3703                         FACILITY:  [facility]   PHYSICIAN:  Lyn Records, M.D.   DATE OF BIRTH:  October 09, 1932   DATE OF ADMISSION:  12/01/2007  DATE OF DISCHARGE:  12/02/2007                               DISCHARGE SUMMARY   DISCHARGE DIAGNOSES:  1. Chest pain, resolved.  2. Coronary artery disease, medical management.  3. Hyperlipidemia.  4. Diabetes mellitus type 2.  5. Gastroesophageal reflux disease.  6. Hypertension.  7. Prostatitis.  8. History of benign prostatic hypertrophy.   HOSPITAL COURSE:  [name] is a 77 year old male patient who was  admitted from the office yesterday after complaining of what sounded  like crescendo angina.  He has had restenosis of the distal saphenous  vein graft to the ramus intermedius on two separate occasions.  This  region has been treated initially with a Taxus stent and subsequently a  Cypher stent.  He now was admitted with angina with minimal activity and  is even having episodes of angina at rest.   The patient remained in the hospital overnight and ruled out a cardiac  isoenzyme.  He underwent cardiac catheterization and was found to have  the following:  Left main with heavy calcification with a 99%  proximal/ostial lesion and a 70% distal lesion.  The LAD was patent with  a ostial 90% first diagonal lesion.  The circumflex had a 70% mid tandem  lesion.  The right coronary artery was occluded in the mid vessel.  The  bypass graft, LIMA to LAD as well as saphenous vein graft to the ramus  intermedius was patent.  There was however 50% in-stent restenosis of  the 1 stent.  Dr. Katrinka Blazing felt at this time the angina was secondary to  this threatened circumflex due to left main stenosis.  He felt that  increase in the Imdur would be appropriate and the patient was given the  Imdur, felt better, and  was ready for discharge to home the same day.   LABORATORY DATA:  During the patient's hospitalization include cardiac  isoenzymes which were negative.  Hemoglobin 9.4, hematocrit 20.1, white  count 5.9, and platelets 270.  Sodium 140, potassium 4.0, BUN 22,  creatinine 1.49.   DISCHARGE MEDICATIONS:  1. Imdur 120 mg a day.  2. Enteric-coated aspirin 325 mg a day.  3. Metoprolol 50 mg a day.  4. Benazepril 20 mg a day.  5. Felodipine 5 mg a day.  6. Plavix 75 mg a day.  7. Lipitor 40 mg a day.  8. Actonel 35 mg a week.  9. Calcium daily.  10.Multivitamins daily.  11.Probiotic daily.  12.AndroGel daily as needed.  13.Iron daily.  14.Sublingual Nitroglycerin p.r.n. chest pain.   DISCHARGE INSTRUCTIONS:  Restart ACTOplus metformin on December 05, 2007.  Remain on low-sodium, heart-healthy diabetic diet.  Clean cath  site gently with soap and water.  No scrubbing.  Increase activity  slowly with no lifting over 10 pounds for 1 week.  No driving for 2  days.  Follow up with Dr. Effie Shy, N.P. on December 16, 2007  at 1:40 p.m. in order to see if his increase in Imdur had subsided his  symptoms.      Guy Franco, P.A.      Lyn Records, M.D.  Electronically Signed    LB/MEDQ  D:  12/02/2007  T:  12/03/2007  Job:  [id number]

## 2010-05-26 NOTE — Discharge Summary (Signed)
[name], [name] NO.:  [id number]   MEDICAL RECORD NO.:  [id number]          PATIENT TYPE:  OIB   LOCATION:  6524                         FACILITY:  [facility]   PHYSICIAN:  [name], M.D.   DATE OF BIRTH:  1932-11-01   DATE OF ADMISSION:  10/10/2004  DATE OF DISCHARGE:  10/11/2004                                 DISCHARGE SUMMARY   ADMITTING DIAGNOSIS:  Coronary atherosclerosis with class III angina due to  high-grade obstruction in the saphenous vein graft to the circumflex.   DISCHARGE DIAGNOSIS:  Coronary atherosclerosis with successful drug-eluting  stent implantation in a saphenous vein graft to the circumflex.   SECONDARY DIAGNOSES:  1.  Diabetes.  2.  Hypertension.  3.  Hyperlipidemia.  4.  Left ventricular dysfunction without congestive heart failure.  5.  Peripheral vascular disease with claudication.   PROCEDURE PERFORMED:  Percutaneous coronary intervention with drug-eluting  stent implantation in the saphenous vein graft to the circumflex (CYPHER  drug-eluting stent).   DISCHARGE INSTRUCTIONS:  Medication regimen will not be changed from that on  admission with the exception that Imdur will be discontinued.  The patient  will therefore be continued on:  1.  Plavix 75 mg per day.  2.  Coated aspirin 375 mg per day.  3.  Avandia 4 mg per day.  4.  Lipitor 40 mg per day.  5.  Metoprolol 50 mg twice a day.  6.  Metformin 1000 mg twice a day.  7.  Hydrochlorothiazide 25 mg per day.  8.  Lotrel 10/20 mg per day.  9.  Zetia 10 mg per day.  10. He will also use sublingual nitroglycerin as needed for chest pain.   ACTIVITY:  Resume normal physical activity starting October 13, 2004.   DIET:  Heart-healthy, fat-modified.   FOLLOWUP:  Dr. Verdis [name] -- 3 weeks.   CONDITION ON DISCHARGE:  Improved.   HISTORY:  The patient has a history of 3 previous coronary bypass  operations.  The most recent was in 1993.  Cardiac catheterization in  the  diagnostic laboratory less than 1 week ago demonstrated high-grade  obstruction in the saphenous vein graft distal segment to the ramus  intermedius/circumflex.  The patient was readmitted today for PCI of the  circumflex graft.  He had been relatively asymptomatic since discharge on an  increased medical regimen.   PHYSICAL AND HOSPITAL COURSE:  His physical exam was unchanged.   Creatinine upon admission was 1.4.   PCI of the saphenous vein graft was performed by Dr. Catalina Gravel and Dr.  Verdis [name] with a good angiographic result, reducing the greater than 95%  stenosis in the distal segment of the graft to less than 10%.  He was left  with a residual 40% to 50% stenosis in the mid-portion of the saphenous vein  graft to the ramus.   The patient's discharge laboratory revealed a hemoglobin of 12, creatinine  of 1.2, potassium of 5.0.   The patient's right groin was without hematoma and he has been asymptomatic  on ambulation.   CONDITION ON DISCHARGE:  His condition on discharge is improved.      [name], M.D.  Electronically Signed     HWS/MEDQ  D:  10/11/2004  T:  10/11/2004  Job:  [id number]   cc:   [facility] Nolberto Hanlon MD   [name]. Coral Spikes, M.D.  Fax: [phone]

## 2010-05-26 NOTE — Cardiovascular Report (Signed)
NAME:  [name], GUERRETTE NO.:  [id number]   MEDICAL RECORD NO.:  [id number]          PATIENT TYPE:  OIB   LOCATION:  1961                         FACILITY:  [facility]   PHYSICIAN:  Lyn Records, M.D.   DATE OF BIRTH:  Aug 26, 1932   DATE OF PROCEDURE:  10/05/2004  DATE OF DISCHARGE:                              CARDIAC CATHETERIZATION   INDICATION FOR STUDY:  Jevan is status post coronary artery bypass grafting  on 3 prior occasions, most recently in 1993.  He has been having progressive  angina over the past 6 months.  He is now having angina with minimal  exertion.   PROCEDURE PERFORMED:  1.  Left heart catheterization.  2.  Selective coronary angiography.  3.  Left ventriculography.  4.  Saphenous vein graft angiography.  5.  Left internal mammary artery angiography.   DESCRIPTION:  After informed consent, a 4-French sheath was placed in the  right femoral artery.  A 4-French Judkins left catheter was then used for  left coronary angiography by Dr. Catalina Gravel.  A 4-French JR4 catheter was  used for right coronary angiography and saphenous vein graft angiography.  Then an Amplatz right 1 catheter followed by a left bypass graft 4-French  catheter was used for attempt at saphenous vein graft angiography.  We then  used a 4-French internal mammary catheter for left internal mammary graft  angiography.  Left ventriculography was performed with a pigtail catheter.  During the ventriculogram, we noticed that there was a patent saphenous vein  graft to the ramus intermedius branch that we had not identified by  selective angiography.  We then placed a multipurpose catheter and with some  searching, we were able to find the saphenous vein graft to the ramus  intermedius branch.  The case was then terminated.  No complications  occurred.   RESULTS:   I. HEMODYNAMIC DATA:  A.  Aortic pressure 149/52 mmHg.  B.  Left ventricular pressure 148/11 mmHg.   II. 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  EF is approximately  40%.  Anterior wall motion is normal.  No mitral regurgitation of  significance is noted.   III. CORONARY ANGIOGRAPHY:  A.  Left main coronary:  70% ostial, 90% distal.  B.  Left anterior descending coronary:  Severely and diffusely diseased  throughout the entire proximal and mid segment.  The first diagonal contains  high-grade disease at the ostium and also in the mid-vessel before it  bifurcates.  The second diagonal is seen to fill by competitive flow.  The  LAD beyond the second diagonal is severely and diffusely diseased, and  appears to fill by collaterals.  C.  Circumflex artery:  The circumflex artery contains multiple luminal  irregularities up to 50% throughout the mid-vessel.  Two small obtuse  marginal branches are noted.  The left atrial recurrent branch is large and  supplies collaterals to the PDA of the right coronary.  D.  Ramus intermedius branch:  Totally occluded proximally.  E.  Right coronary:  The right coronary is totally  occluded in the proximal-  to-mid vessel.  A large right ventricular branch arises from the proximal  part of the right coronary and supplies the distal right coronary by right-  to-right collaterals.   IV. BYPASS GRAFT ANGIOGRAPHY:  A.  Saphenous vein graft to PDA:  Totally  occluded at the aorto-ostial junction.  B.  Saphenous vein graft to the ramus intermedius branch:  This graft is  widely patent.  The graft supplies the ramus and contains an eccentric 40%  to 50% stenosis in the mid-body; the entire mid-body is diffusely diseased.  There is 90% stenosis of distal anastomoses to the ramus intermedius.  C.  LIMA to the LAD:  This graft is widely patent.  Its anastomosis to the  second diagonal is widely patent.  The native LAD appears to be occluded and  fills by collaterals.   V. CONCLUSION:  1.  Severe native  vessel coronary disease with high-grade distal left main,      severe diffuse disease in the proximal and mid left anterior descending      and total occlusion of the distal left anterior descending beyond the      second diagonal, and severe disease in the first diagonal as outlined      above.  The right coronary is totally occluded and fills by collaterals.  2.  Bypass graft occlusive disease with total occlusion of the graft to      posterior descending artery and degenerative disease in the saphenous      vein graft body and distal anastomoses to the ramus intermedius branch.      The distal anastomoses is the culprit lesion for patient's symptoms.  3.  Patent left internal mammary artery to the left anterior descending.  4.  Left ventricular dysfunction with inferior wall akinesis/severe      hypokinesis.  No mitral regurgitation, EF 40% to 50%.   PLAN:  PCI on the saphenous vein graft distal anastomosis to the ramus  intermedius branch; this will be done on October 10, 2004 at 7:30 a.m.      Lyn Records, M.D.  Electronically Signed     HWS/MEDQ  D:  10/05/2004  T:  10/05/2004  Job:  [id number]   cc:   [name]. Coral Spikes, M.D.  Fax: [phone]

## 2010-05-26 NOTE — Cardiovascular Report (Signed)
NAME:  [name], [name] NO.:  [id number]   MEDICAL RECORD NO.:  [id number]          PATIENT TYPE:  OIB   LOCATION:  1961                         FACILITY:  [facility]   PHYSICIAN:  Lyn Records, M.D.   DATE OF BIRTH:  1932-11-02   DATE OF PROCEDURE:  10/05/2004  DATE OF DISCHARGE:                              CARDIAC CATHETERIZATION   ADDENDUM:  job [id number]   The operators for the case performed on October 05, 2004 were Dr. Verdis Prime and Dr. Catalina Gravel.      Lyn Records, M.D.  Electronically Signed     HWS/MEDQ  D:  10/05/2004  T:  10/05/2004  Job:  [id number]

## 2010-05-26 NOTE — Op Note (Signed)
NAME:  [name], [name]                        ACCOUNT NO.:  [id number]   MEDICAL RECORD NO.:  [id number]                   PATIENT TYPE:  INP   LOCATION:  3315                                 FACILITY:  [facility]   PHYSICIAN:  [name], M.D.                 DATE OF BIRTH:  11/25/1932   DATE OF PROCEDURE:  05/25/2003  DATE OF DISCHARGE:                                 OPERATIVE REPORT   PREOPERATIVE DIAGNOSIS:  Severe symptomatic right internal carotid artery  stenosis.   POSTOPERATIVE DIAGNOSIS:  Severe symptomatic right internal carotid artery  stenosis.   PROCEDURE:  Right carotid endarterectomy and Dacron patch angioplasty.   SURGEON:  [name], M.D.   ASSISTANT:  Toribio Harbour, N.P.   ANESTHESIA:  General endotracheal, [place]a Skye. Krista Blue, M.D.   COMPLICATIONS:  None.   DISPOSITION:  To recovery room neurologically intact.   PROCEDURE IN DETAIL:  The patient was taken to the operating room and placed  in the supine position, where the area of the right neck was prepped and  draped in the usual sterile fashion.  An incision was made anterior to the  sternocleidomastoid and carried down through the platysma with  electrocautery.  The sternocleidomastoid was reflected posteriorly and the  carotid sheath was opened.  The facial vein was ligated with 2-0 silk ties  and divided.  The common carotid artery was circled with an umbilical tape  and Rumel tourniquet.  The dissection was continued onto the bifurcation.  The superior thyroid artery was circled with a 2-0 silk Potts tie.  The  external carotid was circled with a blue vessel loop, and the internal  carotid was circled with umbilical tape and Rumel tourniquet.  The  hypoglossal and vagus nerves were identified and preserved.  The patient was  given 7000 units of intravenous heparin.  After adequate circulation time,  the internal, external, and common carotid arteries were occluded.  The  common carotid artery  was opened with an 11 blade and extended  longitudinally with Potts scissors.  A 12 shunt was passed up the internal  carotid, allowed to back bleed, and then down the common carotid, where it  was secured with Rumel tourniquets.  The endarterectomy was begun on the  common carotid artery and the plaque was divided proximally with Potts  scissors.  The endarterectomy was extended on to the bifurcation.  The  external carotid was endarterectomized with an eversion technique and the  internal carotid was endarterectomized in an open fashion.  The remaining  atheromatous debris was removed from the endarterectomy plane.  The Finesse  Hemashield Dacron patch was brought onto the field and was sewn as a patch  angioplasty with a running 6-0 Prolene suture.  Prior to completion of the  anastomosis, the shunt was removed and the usual flushing maneuvers were  undertaken.  The anastomosis was completed and the external,  followed by the  common, and finally the internal carotid artery occlusion clamp was removed.  Excellent flow characteristics were noted with the hand-held Doppler in the  internal  and external carotid arteries.  The patient was given 50 mg of protamine to  reverse the heparin.  The wounds were irrigated with saline, hemostasis with  electrocautery.  The wound was closed with 3-0 Vicryl in the subcutaneous  and subcuticular tissue, Benzoin and Steri-Strips were applied.                                               [name], M.D.    TFE/MEDQ  D:  05/25/2003  T:  05/25/2003  Job:  [id number]

## 2010-05-26 NOTE — Discharge Summary (Signed)
NAME:  [name], [name]                        ACCOUNT NO.:  [id number]   MEDICAL RECORD NO.:  [id number]                   PATIENT TYPE:  INP   LOCATION:  3315                                 FACILITY:  [facility]   PHYSICIAN:  [name], M.D.                 DATE OF BIRTH:  04-11-1932   DATE OF ADMISSION:  05/25/2003  DATE OF DISCHARGE:  05/26/2003                                 DISCHARGE SUMMARY   ADMISSION DIAGNOSIS:  Severe symptomatic right internal carotid artery  stenosis.   PAST MEDICAL HISTORY:  1. Coronary artery disease, status post myocardial infarction and coronary     artery bypass grafting in 1979, 1984, and 1993.  2. ECC VOD, status post left carotid endarterectomy June 2003.  3. Peripheral vascular occlusive disease with bilateral lower extremity     claudication, right worse than left.  4. Hypertension.  5. Diabetes mellitus, type 2.  6. Dyslipidemia.  7. GERD.  8. History of peptic ulcer disease.  9. History of nephrolithiasis.  10.      Other surgical history includes hernia repair in 1996, left wrist     surgery 2004, right knee surgery, bilateral cataract extractions.   ALLERGIES:  No drug allergies; however, MORPHINE causes the patient to  become frantic.   DISCHARGE DIAGNOSIS:  Severe symptomatic right internal carotid artery  stenosis, status post right carotid endarterectomy.   BRIEF HISTORY:  [name] is a 77 year old Caucasian man.  He returned to  the [facility] office in followup of his diffuse peripheral vascular disease as  well as follow up of his carotid disease.  He was evaluated by Dr. Arbie [name] on  May 20, 2003.  At that visit, he reported that he has had several episodes  over the past three months of left hand and left leg numbness and  clumsiness.  These episodes always resolve back to his baseline.  He also  reported to have much worsened claudication in his right leg and is unable  to walk very well with this.  Carotid Doppler exam was  performed on May 12  at the CVTS office and reveals widely patent endarterectomy site on the left  and moderate to severe right internal carotid artery stenosis.  His  ankle/arm index was 0.62 on the right and 0.85 on the left with monophasic  tibial wave forms.  In view of these findings, Dr. Arbie [name] recommended  proceeding with right carotid endarterectomy for stroke risk reduction.  The  procedure risks and benefits were discussed with [name] and he agreed  to proceed with surgery.  Once he has recovered from this surgery, Dr. Arbie [name]  will proceed with further evaluation of his lower extremity arterial  disease.   HOSPITAL COURSE:  On May 25, 2003, [name] was electively admitted to  [facility] under the care of  Dr. Tawanna Cooler Early.  He underwent the following surgical procedure:  Right  carotid endarterectomy with Dacron patch angioplasty.  He tolerated this  procedure well, transferring in stable condition to the PACU.  He was  extubated immediately following surgery.  He awoke from anesthesia  neurologically intact.  He did require some support with blood pressure  support with dopamine after several hours after surgery; however, this was  continued by mid afternoon.  He has remained hemodynamically stable  overnight.  On morning rounds, May 18, postoperative day #1, [name]  reports feeling well.  He is anxious to go home.  His vital signs are stable  with blood pressure 145/55.  He is afebrile.  His room air saturation is  95%.  His heart is maintaining normal sinus rhythm.  His lungs are clear to  auscultation bilaterally.  He is tolerating his diet without nausea, he is  voiding without difficulty, his urine output is adequate.  His right neck  incision is clean and dry.  There is mild swelling at mid incision.  He has  no difficulty swallowing.  He remains neurologically intact.  He is  ambulating independently.  His pain control is adequate.  [name] is  ready for discharge home today, May 26, 2003.   LABORATORY STUDIES:  May 18:  CBC:  White blood cells 5.9, hemoglobin 8.7,  hematocrit 25.8, platelets 354.  Chemistries included sodium 133, potassium  4.1, BUN 17, creatinine 1.4, glucose 126.   CONDITION ON DISCHARGE:  Improved.   DISCHARGE MEDICATIONS:  He is to resume his home medicines of:  1. Metoprolol 25 mg b.i.d.  2. Lotrel 10/20 mg daily.  3. Lipitor 40 mg daily.  4. Aspirin 325 mg daily.  5. Prevacid 30 mg daily.  6. Avandamet 2/500 mg b.i.d.   PAIN MANAGEMENT:  He may have Tylox 1-2 p.o. q.4h. p.r.n.   ACTIVITY:  He has been asked to refrain from any driving or heavy lifting  for the next two weeks.   DIET:  Diet should continue to be a carbohydrate-modified diet.   WOUND CARE:  He is to clean his incision daily with soap and water.  He may  shower beginning Thursday, May 19.   FOLLOW UP:  He has an appointment to see Dr. Arbie [name] at the CVTS office on  Thursday, June 10, 2003, at 12 noon.      [name], N.P.                  [name], M.D.    CTK/MEDQ  D:  05/26/2003  T:  05/27/2003  Job:  [id number]   cc:   [name], M.D.   [name], M.D.  [address]  [place], [place] [address]

## 2010-05-26 NOTE — Cardiovascular Report (Signed)
NAMEKENECHUKWU, ECKSTEIN NO.:  [id number]   MEDICAL RECORD NO.:  [id number]          PATIENT TYPE:  OIB   LOCATION:  6524                         FACILITY:  [facility]   PHYSICIAN:  Lyn Records, M.D.   DATE OF BIRTH:  October 25, 1932   DATE OF PROCEDURE:  DATE OF DISCHARGE:                              CARDIAC CATHETERIZATION   PROCEDURE PERFORMED:  Drug-eluting stent saphenous vein graft to the ramus.   INDICATIONS:  Class III angina in a patient with three prior coronary bypass  operations most recently 1993. The patient currently has been identified to  have a high grade distal stenosis in the saphenous vein graft to the ramus.   OPERATORS:  Corky Crafts, MD and Lyn Records, III,  M.D.   DESCRIPTION:  A 6-French sheath was started on the right femoral artery by  Dr. Eldridge Dace. The patient was given a bolus followed by an infusion of  Angiomax.  The lesion was then visualized using a #1 left Amplatz guide  catheter. A BMW wire was then used to cross into the distal ramus  intermedius territory via the graft. A 23 x 2.5 mm diameter stent was then  used. This stent would not cross the mid vessel lesion because the guide  catheter continued to back out of the os. We then placed an [employer]  wire and then actually used that as the working wire. We were then able to  advance the stent into the region of high-grade stenosis. The BMW wire was  then removed.  The stent was appropriately placed and then deployed.  Postdilatation was initially done with a 3.5 x 13 mm long PowerSail and the  more distal part of the stent was postdilated with a 3.0 x 12 mm Quantum to  peak pressures of 15 atmospheres in both spots. Final angiographic result  demonstrated a less than 10% stenosis with TIMI grade 3 flow. Vessel  occlusion reproduced the patient's clinical symptoms. He left the cath lab  suite, having no chest discomfort.   CONCLUSION:  Successful drug-eluting stent  implantation of the distal  saphenous vein graft to the ramus intermedius branch from 95 to less than  10% with TIMI grade 3 flow.   PLAN:  1.  Aspirin and Plavix.  2.  Bivalirudin has been discontinued.  3.  IV nitro.  4.  Home in the a.m.      Lyn Records, M.D.  Electronically Signed     HWS/MEDQ  D:  10/10/2004  T:  10/10/2004  Job:  [id number]   cc:   Fayrene Fearing M.D. Etheleen Mayhew [phone]  [facility]

## 2010-05-26 NOTE — Discharge Summary (Signed)
[place]. [facility]  Patient:    [name], [name] Visit Number: [id number] MRN: [id number]          Service Type: MED Location: [id number] Attending Physician:  Mayra Neer Dictated by:   Jerl Santos, M.D. Admit Date:  03/12/2001 Discharge Date: 03/13/2001   CC:         [name]. Coral Spikes, M.D.   Discharge Summary  HISTORY OF PRESENT ILLNESS:  [name] is a 77 year old man who is a patient of Dr. Coral Spikes.  [name] presented to the office on March 12, 2001, with acute onset of mid epigastric pain.  There was no associated diaphoresis, nausea, or breathing difficulty.  The pain seemed to be crescendo in nature and was not associated with vomiting, melena, or hematochezia.  In the office, the patients physical examination was unremarkable.  Electrocardiogram was obtained which showed only the patients baseline right bundle branch block. KUB and upright abdomen as well as upright chest x-ray were negative.  The patient was admitted for further evaluation of severe epigastric pain.  PAST MEDICAL HISTORY: Remarkable for coronary artery disease, hyperlipidemia, hypertension, diabetes, gastroesophageal reflux disease, and peripheral vascular disease.  PHYSICAL EXAMINATION:  VITAL SIGNS:  Blood pressure 160/100, pulse 90.  Weight 160 pounds.  HEENT:  Within normal limits.  CHEST:  Clear.  BACK:  No CVA or point tenderness.  CARDIOVASCULAR:  Normal S1 and S2 without rubs, murmurs, or gallops.  ABDOMEN:  The patient subjectively complained of severe epigastric pain, but there was minimal discomfort on deep palpation.  There was no guarding or rebound.  Bowel sounds were normal.  RECTAL:  Negative.  NEUROLOGIC:  Normal.  EXTREMITIES:  Normal.  DIAGNOSTIC DATA:  White count 8900, hemoglobin 15.1.  Potassium 3.3. Hemoglobin A1C 6.9.  Liver profile normal.  Serial cardiac enzymes negative. Troponin was normal.  X-ray studies  obtained included a CT scan of the abdomen which, by verbal report, was negative.  I cannot find the written report in the chart.  HOSPITAL COURSE:  By March 6, the patient seemed to be doing better.  He was comfortable and was not complaining of epigastric pain.  Therefore, on March 6, the patient was discharged.  DISCHARGE DIAGNOSES: 1. Acute onset of severe epigastric pain of unknown etiology. 2. History of coronary artery disease status post coronary artery bypass    grafting. 3. Hypertension. 4. History of diabetes. 5. History of gastroesophageal reflux disease.  DISCHARGE MEDICATIONS:  At the time of discharge, the patient was instructed to continue his usual home medications.  No additional medications were initiated. 1. Aspirin 325 mg daily. 2. Prevacid 30 mg daily. 3. Metoprolol 25 mg b.i.d. 4. Glucotrol XL 5 mg daily. 5. Gemfibrozil 600 mg b.i.d. 6. Lipitor 40 mg daily. 7. Norvasc 10 mg daily.  FOLLOWUP:  With Dr. Coral Spikes. Dictated by:   Jerl Santos, M.D. Attending Physician:  Mayra Neer DD:  04/29/01 TD:  04/29/01 Job: [id number]

## 2010-05-26 NOTE — Discharge Summary (Signed)
[place]. [facility]  Patient:    [name], [name] Visit Number: [id number] MRN: [id number]          Service Type: SUR Location: 3300 [id number] [id number] Attending Physician:  Alyson Locket Dictated by:   Maxwell Marion, RNFA Admit Date:  06/23/2001 Discharge Date: 06/24/2001   CC:         [name]. [name], M.D.   Discharge Summary  DATE OF BIRTH:  [birth year]  ADMISSION DIAGNOSIS:  Left carotid stenosis.  PAST MEDICAL HISTORY 1. Carotid artery disease, Doppler study in June 2002 revealed moderate to    severe stenoses bilaterally. 2. Coronary artery disease, status post coronary artery bypass grafting in    1979, 1984 and 1993. 3. Type 2 diabetes mellitus. 4. Hypertension. 5. Hyperlipidemia. 6. Arthritis.  ALLERGIES:  No known drug allergies.  DISCHARGE DIAGNOSIS:  Left carotid stenosis, status post left carotid endarterectomy.  BRIEF HISTORY:  The patient is a 77 year old Caucasian man who returned to the CVTS on June 19, 2001, because of carotid disease symptoms. He was experiencing some right upper extremity tingling as well as right lower extremity weakness. A duplex done at the office was unchanged from his last study done (moderate to severe stenoses bilaterally). He was evaluated on the 12th by Dr. Arbie Cookey. After examination of the patient and review of medical records, including the duplex scans, Dr. Arbie Cookey recommended left carotid endarterectomy for decreasing his stroke risk. The procedure risks and benefits were discussed with the patient and he agreed to proceed.  HOSPITAL COURSE:  On June 23, 2001, the patient was electively admitted to [facility] under the care of Dr. Kristen Loader. Early. He underwent a left carotid endarterectomy with Dacron patch angioplasty. He tolerated this procedure well and was transferred in stable condition to the PACU. He emerged from anesthesia neurologically intact. He remained hemodynamically  stable, and his postoperative course has been uneventful.  On postoperative day #1 the patient reports feeling very well. His vital signs were stable and he is afebrile. His heart has maintained normal sinus rhythm. His lungs were clear to auscultation bilaterally and he remained neurologically intact. He has no GI complaints and he is voiding without difficulty after Foley catheter removal. In fact his urine output was greater than 1000 cc overnight. His left neck incision is clean and dry with some mild swelling, no impairment in breathing or swallowing. He is ambulating independently in his room and his pain is well controlled. The patients potassium this morning is decreased to 3.0. The plan is to replete the potassium today as well as send him home on a supplement.  The patient is making good progress and recovering from his carotid endarterectomy and is ready for discharge today. The remainder of his laboratory studies from this morning, June 24, 2001, white blood cells 10.2, hemoglobin 12.5, hematocrit 36.3, platelets 264. Potassium 139, potassium 3.0, chloride 107, CO2 27, BUN 9, creatinine 0.8, glucose 121, calcium 9.2.  CONDITION ON DISCHARGE:  Improved.  DISCHARGE INSTRUCTIONS:  Activity, he has been asked to refrain from any driving for at least 10 days and no heavy lifting. He is asked to continue his breathing exercises and his daily walking. Diet should be low fat, low salt diet. Wound care, he may shower Thursday, until then clean his incision daily with mild soap and water. He has been instructed to examine his incision daily, and if it becomes red, hot, swollen, draining, or if he has a fever  greater than 101 degrees Fahrenheit he is to call Dr. Nicholes Mango office.  DISCHARGE MEDICATIONS 1. Tylox 1 to 2 p.o. q.4-6h. p.r.n. pain. 2. Potassium chloride 20 mEq p.o. q.d. x 14 days. He has also been instructed to resume his home medications of: 3. Metoprolol 25 mg p.o.  q.d. 4. Norvasc 10 mg p.o. q.d. 5. Aspirin 325 mg p.o. q.d. 6. Prevacid 30 mg p.o. q.d. 7. Glucotrol 5 mg p.o. q.d. 8. Gemfibrozil 600 mg p.o. b.i.d. 9. Lipitor 40 mg p.o. q.d.  FOLLOWUP 1. He has an appointment on July 10, 2001, at 12:10 in the afternoon at the    CVTS office to see Dr. Arbie Cookey. 2. He has been asked to follow up with Dr. Coral [name] in approximately two weeks,    specifically regarding his potassium. Dictated by:   Maxwell Marion, RNFA Attending Physician:  Alyson Locket DD:  06/24/01 TD:  06/25/01 Job: [id number]

## 2010-05-26 NOTE — Op Note (Signed)
[place]. [facility]  Patient:    [name], [name] Visit Number: [id number] MRN: [id number]          Service Type: SUR Location: 3300 3303 01 Attending Physician:  Alyson Locket Dictated by:   [name], M.D. Admit Date:  06/23/2001 Discharge Date: 06/24/2001   CC:         [name]. Coral Spikes, M.D.   Operative Report  PREOPERATIVE DIAGNOSIS:  Symptomatic left internal carotid artery stenosis.  POSTOPERATIVE DIAGNOSIS:  Symptomatic left internal carotid artery stenosis.  OPERATION PERFORMED:  Left carotid endarterectomy and Dacron patch angioplasty.  SURGEON:  [name], M.D.  ASSISTANT: 1. Di Kindle. Edilia Bo, M.D. 2. Maxwell Marion, RNFA  ANESTHESIA:  General endotracheal.  COMPLICATIONS:  None.  DISPOSITION:  Stable and neurologically intact.  DESCRIPTION OF PROCEDURE:  The patient was taken to the operating room and placed in supine position where the area of the left neck was prepped and draped in the usual sterile fashion.  Incision was made in the anterior sternocleidomastoid and carried down through the platysma with electrocautery. The sternocleidomastoid was reflected posteriorly and the carotid sheath was opened.  The facial vein was ligated with 2-0 silk ties and divided.  The vagus and hypoglossal nerves were identified and preserved.  The common carotid artery was encircled with umbilical tape and Rumel tourniquet.  The superior thyroid artery was encircled with 2-0 silk Potts tie.  The external carotid artery was encircled with a blue vessel loop and the internal carotid artery was encircled with an umbilical tape and Rumel tourniquet.  The patient was given 7000 units of intravenous heparin.  After adequate circulation time, the internal, external and common carotid arteries were occluded.  The common carotid artery was opened with the 11 blade, extended longitudinally with Potts scissors through the plaque  onto the internal carotid artery.  A 12 French catheter was passed up the internal carotid, allowed to back-bleed, then down the common carotid artery where it was secured with Rumel tourniquets.  The endarterectomy was begun on the common carotid artery and the plaque was divided proximally with Potts scissors.  The endarterectomy was extended on to the bifurcation and the external carotid artery was endarterectomized with eversion technique.  The internal carotid artery was then endarterectomized in an open fashion.  The remaining atheromatous debris was removed from the endarterectomy plane.  A Finesse Hemashield Dacron patch was brought onto the field and sewn as a patch angioplasty with a running 6-0 Prolene suture.  Prior to completion of the anastomosis, the shunt was removed and the usual flushing maneuvers were undertaken.  An anastomosis was then completed and the external followed by the common and finally the internal carotid artery occlusion clamp was removed.  Excellent flow characteristics were noted with hand held Doppler in the internal and external carotid arteries.  The patient was given 50 mg of Protamine to reverse heparin.  The wounds were irrigated with saline.  Hemostasis was obtained with electrocautery.  Wounds were closed with several 3-0 Vicryl sutures to reapproximate the sternocleidomastoid over the carotid sheath.  Next, the platysma was closed with running 3-0 Vicryl suture.  Finally the skin was closed with 4-0 subcuticular Vicryl stitch.  Benzoin and Steri-Strips were applied.  The patient was awakened, neurologically intact and was transferred to the recovery room in stable condition. Dictated by:   [name], M.D. Attending Physician:  Alyson Locket DD:  06/23/01 TD:  06/24/01 Job: [id number]  [id number]

## 2010-05-26 NOTE — H&P (Signed)
NAME:  [name], [name]                        ACCOUNT NO.:  [id number]   MEDICAL RECORD NO.:  [id number]                   PATIENT TYPE:  INP   LOCATION:  NA                                   FACILITY:  [facility]   PHYSICIAN:  [name], M.D.                 DATE OF BIRTH:  15-Jul-1932   DATE OF ADMISSION:  05/25/2003  DATE OF DISCHARGE:                                HISTORY & PHYSICAL   PHYSICIANS:  Primary care physician:  Linward Natal, M.D.  Cardiologist:  Nolberto Hanlon, M.D.   CHIEF COMPLAINT:  Carotid artery disease.   HISTORY OF PRESENT ILLNESS:  This is a 77 year old male referred by Dr.  Wenda Low for evaluation of carotid artery disease.  The patient is well known  to Dr. Arbie Cookey.  He does have diffuse peripheral vascular disease and is  status post left carotid endarterectomy secondary to symptomatic carotid  disease in June 2003.  He has had no further neurologic deficits.  He does  complain of three episodes of near syncope.  The patient continues to report  worsening bilateral lower extremity claudication symptoms.  He has no new  cardiac problems.  Carotid duplex was performed which revealed no  significant change from previous study six months earlier with no recurrent  stenosis on the left and a 60 to 90% stenosis on the right.  The patient  received cardiac clearance for surgery by Dr. Salome Spotted.  ABIs performed on  May 20, 2003, revealed 0.62 on the right and 0.85 on the left.  For  approximately the last three months, the patient has complained of episodes  of left hand numbness, weakness, and clumsiness.  The patient denies  headache, nausea, vomiting, vertigo, dizziness, falls, seizures, muscle  weakness, speech impairment, dysphagia, vision changes, confusion, memory  loss, and syncope.   PAST MEDICAL HISTORY:  1. Carotid artery disease.  2. Hypertension.  3. Diabetes mellitus.  4. Coronary artery disease.  5. Myocardial infarction x 2.  6. Claudication  symptoms in bilateral lower extremities.  7. Peptic ulcer disease.  8. Gastroesophageal reflux disease.  9. Nephrolithiasis.  10.      Hyperlipidemia.   PAST SURGICAL HISTORY:  1. Coronary artery bypass graft surgery x 3 in 1979, 1984, and 1993.  2. Hernia repair in 1996.  3. Left wrist surgery in 2004.  4. Left carotid endarterectomy in 2003.  5. Right knee surgery.  6. Bilateral cataract removal.   MEDICATIONS:  1. Avandamet 2/500 mg 1 daily.  2. Lipitor 40 mg 1 q.h.s.  3. Lotrel 10/20 mg 1 daily.  4. Prevacid 30 mg 1 q.a.m.  5. Metoprolol 50 mg 1/2 tablet b.i.d.  6. Aspirin 325 mg 1 daily.  7. Testosterone injection 1.5 ml every 2 weeks.   ALLERGIES:  MORPHINE causes patient to become frantic.   REVIEW OF SYSTEMS:  Please see HPI for significant positives and negative.  Otherwise,  positive for diabetes mellitus and negative for kidney disease  and asthma.   FAMILY HISTORY:  A Mother with kidney problems and a sister with heart  disease.   SOCIAL HISTORY:  This is a married male with one child.  He is retired.  The  patient denies alcohol use, and he quit smoking approximately 12 years ago  after smoking less than one pack per day for 15 years.   PHYSICAL EXAMINATION:  VITAL SIGNS: Blood pressure 168/78, pulse 65,  respirations 12.  GENERAL:  This is a 77 year old white in no acute distress.  He is alert and  oriented x 3.  HEENT:  Normocephalic and atraumatic.  Pupils equal, round, and reactive to  light and accommodation.  Extraocular movements are intact.  No cataracts,  glaucoma, or macular degeneration.  NECK:  Supple with no JVD.  There is a harsh bruit auscultated on the right  and a faint bruit auscultated on the left.  There is no lymphadenopathy.  CHEST:  Symmetrical on inspiration.  LUNGS:  No wheezes, rhonchi, or rales.  CARDIAC:  Regular rate and rhythm with no murmurs, rubs, or gallops.  ABDOMEN:  Soft, nontender.  Bowel sounds auscultated x 4.  There  are no  masses and no bruits.  GU/RECTAL:  Deferred.  EXTREMITIES:  No clubbing, cyanosis, or ulcerations.  There is trance edema  in bilateral lower extremities.  Temperature is warm on the left and  decreased in the right lower extremity.  PULSES:  Carotid 2+ bilaterally, femoral 1+ bilaterally, popliteal 1+ on the  left, and the popliteal, dorsalis pedis, and posterior tibial pulses on the  right were not palpable.  NEUROLOGIC:  Nonfocal.  Gait is steady.  Deep tendon reflexes are 2+.  Muscle strength is 5/5.   IMPRESSION:  Right internal carotid artery stenosis.   PLAN:  Right carotid endarterectomy by Dr. Gretta Began at [facility]  on May 25, 2003.  Dr. Arbie Cookey has seen and evaluated this patient prior to  admission and has explained the risks and benefits of the procedure.  The  patient has agreed to continue.      Pecola Leisure, [place]                      [name], M.D.    AY/MEDQ  D:  05/24/2003  T:  05/24/2003  Job:  [id number]

## 2010-05-26 NOTE — H&P (Signed)
[place]  Patient:    [name], [name] Visit Number: [id number] MRN: [id number]          Service Type: MED Location: [phone] Attending Physician:  Mayra Neer Dictated by:   Jerl Santos, M.D. Admit Date:  03/12/2001                           History and Physical  SOCIAL SECURITY #: [id number]  DATE OF BIRTH: Aug 17, 1932  CHIEF COMPLAINT: [name] is a 77 year old man, who is a patient of Dr. Coral Spikes.  He presents to the office with onset of severe mid epigastric pain.  HISTORY OF PRESENT ILLNESS: About 7:30 a.m. today [name] indicates that he began to experience a sharp, squeezing discomfort which he localizes to the mid epigastric area.  It clearly seems to be different from any pain he has had related to his heart.  There was no associated diaphoresis, nausea, or breathing difficulty.  The pain will come on crescendo in severity, cause him to have quite a bit of discomfort, and then mysteriously go away for several minutes.  It has not increased in intensity.  He says that his appetite is reasonably good.  He has not been vomiting.  He says his bowel function has been normal.  There has been no hematemesis or melena.  In the office the patients physical examination was unremarkable.  Electrocardiogram was obtained which was showed baseline right bundle branch block.  An upright chest as well as a KUB and upright abdomen were obtained and these were negative.  Some stool was seen in the colon.  The patient is admitted at this time for further evaluation of severe mid epigastric pain.  PAST MEDICAL HISTORY: Quite significant.  1. Coronary artery disease.  [name] has had three CABGs, the last one     being in 1993.  He is followed by Dr. Verdis Prime.  He denies any recent     problems with dyspnea on exertion, exertional chest pain, palpitations,     syncope, or dizziness.  There has been no orthopnea or  PND.  2. Hyperlipidemia.  The patient seems to be stable in this regard.  3. Hypertension.  In general his blood pressure is well regulated.  4. Diabetes.  The patient takes a Glucotrol tablet for regulation of his     blood sugar.  His hemoglobin A1Cs have been in the 7 range.  5. Gastroesophageal reflux.  The patient has a history of esophageal     stricture.  He has not had any recent problems with dysphagia,     indigestion, nausea, or vomiting.  6. Peripheral vascular disease.  The patient has a history of claudication     and has documented bilateral femoral bruits.  CURRENT MEDICATIONS:  1. Aspirin 325 mg q.d.  2. Prevacid 30 mg q.d.  3. Metoprolol 25 mg b.i.d.  4. Glucotrol XL 5 mg q.d.  5. Gemfibrozil 600 mg b.i.d.  6. Lipitor 40 mg q.d.  7. Norvasc 10 mg q.d.  ALLERGIES: Essentially none.  PAST SURGICAL HISTORY:  1. Removal of skin cancer by Dr. Earlene Plater in the past.  2. As previously mentioned, he has had three CABG procedures.  3. Excision of rectal abscess.  SOCIAL HISTORY: The patient is on chronic disability for heart disease and fibromyalgia.  He does not smoke or consume alcohol.  REVIEW OF SYSTEMS: Otherwise negative.  PHYSICAL EXAMINATION:  VITAL SIGNS: His blood pressure in the office was 160/106, pulse was 90, temperature 98.4 degrees.  Weight was 160 pounds.  HEENT: Within normal limits.  NECK: There were audible carotid bruits.  CHEST: Clear.  BACK: No CVA or point tenderness.  COVERAGE: Normal S1 and S2.  No rubs, murmurs, or gallops.  ABDOMEN: Mild mid epigastric tenderness.  However, the patient complains of quite severe discomfort in this area.  There does not seem to be any guarding or rebound.  Bowel sounds are present.  The remainder of the abdominal examination is normal.  NEUROLOGIC: Neurologic testing and examination of extremities is normal.  IMPRESSION:  1. Severe epigastric pain of uncertain etiology.  2. History of coronary  artery disease, status post coronary artery bypass     grafting x3.  3. Hyperlipidemia.  4. History of fibromyalgia.  5. Hypertension.  6. Diabetes.  7. Gastroesophageal reflux.  8. History of bilateral carotid bruits.  9. History of peripheral vascular disease.  PLAN:  1. The patient will be admitted for observation.  2. I am going to try to obtain an abdominal ultrasound as soon as possible     to take a look at his pancreas, abdominal aorta, and other intra-abdominal     structures.  3. We will follow him quite closely. Dictated by:   Jerl Santos, M.D. Attending Physician:  Mayra Neer DD:  03/12/01 TD:  03/13/01 Job: [id number]

## 2010-05-26 NOTE — Op Note (Signed)
NAME:  [name], [name]                        ACCOUNT NO.:  [id number]   MEDICAL RECORD NO.:  [id number]                   PATIENT TYPE:  OIB   LOCATION:  2858                                 FACILITY:  [facility]   PHYSICIAN:  [name], M.D.                 DATE OF BIRTH:  23-Oct-1932   DATE OF PROCEDURE:  06/23/2003  DATE OF DISCHARGE:                                 OPERATIVE REPORT   PREOPERATIVE DIAGNOSIS:  Bilateral lower extremity arterial insufficiency.   POSTOPERATIVE DIAGNOSIS:  Bilateral lower extremity arterial insufficiency.   PROCEDURES:  1. Aortogram with bilateral lower extremity runoff.  2. Bilateral iliac artery balloon angioplasty and stent placement.   SURGEON:  [name], M.D.   ANESTHESIA:  1% lidocaine local.   COMPLICATIONS:  None.   DISPOSITION:  To the holding area stable and then with 23-hour evaluation.   PROCEDURE IN DETAIL:  The patient was taken to the peripheral vascular  catheterization lab, placed in the supine position, where the area of both  groins was prepped and draped in the usual sterile fashion.  Using local  anesthesia and a single wall puncture, the right common femoral artery was  entered and a guidewire was passed up to the level of the suprarenal aorta.  A 5 French sheath was passed over the guidewire and a pigtail catheter was  positioned at the level of the suprarenal aorta.  AP projection was  undertaken.  This revealed single left and two right renal arteries.  There  was moderate stenosis at the left renal artery takeoff.  There was marked  irregularity of the infrarenal aorta with no evidence of flow-limiting  stenosis.  The pigtail catheter was then withdrawn down to the level of  above the aortic bifurcation, and AP projection was undertaken.  This  revealed extensive irregularity in the iliac arteries bilaterally.  There  was high-grade stenosis in the area of the junction of the right iliac  artery bifurcation.  The  internal iliac arteries were occluded bilaterally.  The IMA was patent with collateral flow to the pelvis as well as flow from  the external iliac artery to the pelvis.  There was high-grade stenosis  focally in the right iliac artery, also irregularity and two areas of  stenosis in the left common iliac artery.  There was also stenosis at the  right common femoral artery.  Runoff was then obtained, and this revealed a  patent superficial femoral artery and profunda femoris arteries bilaterally.  There was high-grade stenosis at the adductor canal bilaterally with no  complete occlusion.  The popliteal arteries were widely patent bilaterally,  and there was three-vessel runoff bilaterally.  There was an aberrant  takeoff the anterior tibial on the left.  A decision was made to proceed  with iliac artery angioplasty, and this was discussed with Mr. Edmonston.  The  left groin was anesthetized with  1% lidocaine local and access was obtained  and a long 7 sheath was passed to the level of the aortic bifurcation.  The  patient was given 4000 units of intravenous heparin.  Next an 8 x 39 Genesis  stent on an Opti balloon was chosen.  It was passed through the long 7  sheath to the level of the stenosis.  The sheath was withdrawn and the  balloon was expanded over both lesions in the left common iliac artery.  Retrograde injection following this revealed no residual stenosis.  Next the  right 5 French sheath was exchanged for a long 7 sheath and the area of  stenosis in the right iliac system was addressed.  An 8 x 29 stent and  balloon was positioned to the level of stenosis and inflated at 10  atmospheres.  Retrograde injection through this showed some slight residual  narrowing in this area, and therefore a 9 mm, 2 cm balloon was positioned in  the stent and inflated to 10 atmospheres.  The pigtail catheter was then  repositioned and this repeat aortic injection showed no evidence of residual   stenosis bilaterally.  The patient tolerated the procedure without immediate  complication and was transferred to the holding area, where sheaths were  pulled when the ACT was normalized.   FINDINGS:  1. Bilateral iliac artery occlusive disease.  2. Bilateral focal stenosis at the adductor canal in the superficial femoral     artery bilaterally.  3. Normal popliteal arteries with three-vessel runoff bilaterally.  4. Successful bilateral iliac angioplasty and stent placement.                                               [name], M.D.    TFE/MEDQ  D:  06/23/2003  T:  06/23/2003  Job:  [id number]

## 2010-05-26 NOTE — Cardiovascular Report (Signed)
[name], [name] NO.:  [id number]   MEDICAL RECORD NO.:  [id number]          PATIENT TYPE:  OIB   LOCATION:  2807                         FACILITY:  [facility]   PHYSICIAN:  Lyn Records, M.D.   DATE OF BIRTH:  [birth year]   DATE OF PROCEDURE:  11/30/2005  DATE OF DISCHARGE:                            CARDIAC CATHETERIZATION   INDICATIONS:  Class IV angina.  Previous coronary artery bypass grafting  on 3 prior occasions, most recently 1993.  The study is being done to  evaluate anatomy in this patient who had undergone saphenous vein graft  PCI with stenting in October 2006 using a Cypher.  He has had  progressive symptoms over the past 4-6 weeks.   PROCEDURES PERFORMED:  1. Left heart cath.  2. Selective coronary angio.  3. Left ventriculography.  4. Bypass graft angiography, saphenous vein.  5. LIMA graft angiography.  6. Intravascular ultrasound of the saphenous vein graft to the ramus.  7. Drug-eluting stent implantation in the saphenous vein graft to the      ramus for restenosis, Taxus.   DESCRIPTION:  After informed consent, a 6-French sheath was placed in  the right femoral artery using a modified Seldinger technique.  A 6-  [place] A2 multipurpose catheter was used for hemodynamic recordings,  left ventriculography by hand injection, selective saphenous vein graft  angiography.  We then used a #4 6-French left Judkins catheter for left  coronary angiography.  We used an internal mammary artery catheter for  internal mammary artery angiography.  We used a left bypass graft  catheter for selective saphenous vein graft angiography of the graft to  the ramus.  After reviewing the cines, we felt that the patient's  symptom was due to restenosis in the distal margin of the stent to the  ramus.   We gave an Integrilin bolus followed by an infusion.  The ACT was  documented to be greater than 300 seconds.  We used an Amplatz #1 6-  French left coronary  system guide catheter.  The [employer] wire was  used.  We used an Atlantis catheter IVUS device to IVUS the restenotic  lesion in the saphenous vein graft to the ramus.  We then performed  balloon angioplasty using a 3.25 x 15 mm balloon.  Peak pressure 15  atmospheres.  We re-IVUSed.  There was continued luminal narrowing less  than 4 sq mm.  We then also noticed that there was 70-80% stenosis in  the native vessel just beyond the graft insertion site beyond the  previously placed stents.  We then deployed a 24 mm long by 3 mm Taxus  Express II drug-eluting stent to 13 atmospheres.  We then post-dilated  with a 20 mm long by 3.25 Quantum balloon to 14 atmospheres.  We then  upgraded to a 3.5 x 15 mm Quantum Maverick balloon and post-dilated to  16 atmospheres.  Two balloon inflations were performed.  IVUS post-  procedure still demonstrated a minimal luminal area of 4.6 sq mm in the  region of restenosis that was very focal.  The stent  was well opposed in  all regions including the area of constriction.  There was  circumferential calcium in this region.  TIMI grade 3 flow was noted.  Angio-Seal was not performed.   RESULTS:  1. Hemodynamic data:      a.     Aortic pressure 160/51.      b.     Left ventricular pressure 164/15.  2. Left ventriculography:  There is moderate inferobasal hypokinesis.      EF is 60 is 50%.  3. Coronary angiography:      a.     Left main coronary:  Heavily calcified with distal 90+       percent stenosis and proximally centric 60% stenosis.      b.     Left anterior descending coronary:  Competition with the       LIMA graft is noted with antegrade injection of the left main.  A       large first septal perforator is noted to arise.  There is high-       grade obstruction in the proximal portion of the LAD.      c.     Circumflex artery:  Circumflex artery gives origin to 2       small distal obtuse marginal branches.  The circumflex contains 60-        70% stenosis in the mid body before the small obtuse marginal       branches.      d.     Ramus branch:  The ramus intermedius branch is totally       occluded and supplied by a saphenous vein graft.      e.     Right coronary:  The right coronary artery is totally       occluded in the mid-vessel.  The distal RCA fills by collaterals       from the LAD that fills by the LIMA.  4. Bypass graft angiography:  Saphenous vein graft to the ramus:  This      graft is degenerated.  An eccentric less than 50% stenosis that      appears to be an ulcerated plaque is noted in the mid-vessel.  The      previously placed distal stent has in-stent focal restenosis of      greater than 95%.  The native vessel beyond the stent margin also      contains a 70-80% stenosis; perhaps restenosis from balloon trauma.      Flow was TIMI grade 3.  5. LIMA to the LAD:  This graft is widely patent.  The LIMA supplies      the diagonal that gives retrograde flow into the more proximal left      anterior descending, into a large septal perforator, into a large      diagonal and collaterals to the LAD around the apex.  Distal RCA      collaterals are also noted.  6. PCI:  PCI of the restenotic saphenous vein graft lesion in the      distal graft to the ramus was reduced from 95% to less than 20%      with a cross-sectional minimal luminal diameter in the restenotic      lesion of about 4.6 sq __________.  TIMI grade 3 flow is noted.      Stent is well opposed by IVUS in all segments.   CONCLUSIONS:  1. Class IV angina secondary to  restenosis of the previous Cypher      stent placed in the saphenous vein graft.  The reason for      restenosis is probably that the stent was under expanded and the      vessel size was underestimated.  There is also circumferential      calcium preventing expansion within the focal region of restenosis. 2. Successful angioplasty and restenting obtaining a cross-sectional      area  greater than 4.6 sq mm.  There is still circumferential      calcium and focal narrowing less than 20% angiographically.  A 3.5      mm balloon to 15 atmospheres could not break this.  This balloon      was appropriately sized for the stented vessel and we did not want      to oversize for fear of rupture.  3. Severe native vessel disease with left main high-grade obstruction,      functional total occlusion of the LAD, total occlusion of the      ramus, and total occlusion of the right coronary.  4. Widely patent LIMA that collateralizes the distal LAD and the right      coronary.  5. Mild left ventricular dysfunction.   PLAN:  Aspirin and Plavix for a life time.  Hold for discharge December 01, 2005.  High risk for restenosis.      Lyn Records, M.D.  Electronically Signed     HWS/MEDQ  D:  11/30/2005  T:  11/30/2005  Job:  [id number]   cc:   [name]. Coral Spikes, M.D.

## 2010-05-26 NOTE — Op Note (Signed)
NAMEHAKIM, [name] NO.:  [id number]   MEDICAL RECORD NO.:  [id number]          PATIENT TYPE:  OIB   LOCATION:  2008                         FACILITY:  [facility]   PHYSICIAN:  [name], M.D.    DATE OF BIRTH:  1932/06/18   DATE OF PROCEDURE:  03/21/2005  DATE OF DISCHARGE:  03/22/2005                                 OPERATIVE REPORT   PREOPERATIVE DIAGNOSIS:  Severe limiting claudication of right leg.   POSTOP DIAGNOSIS:  Severe limiting claudication of right leg with right  distal superficial femoral artery occlusion.   PROCEDURE:  1.  Aortogram with bilateral lower extremity runoff.  2.  Recanalization right distal superficial femoral artery with angioplasty      and a Smart stent placement.   SURGEON:  [name], M.D.   ASSISTANT:  Nurse.   ANESTHESIA:  1% lidocaine local.   COMPLICATIONS:  Slight extravasation at the level of the angioplasty site  which was treated with reinflation of the balloon with no further  extravasation.   DISPOSITION:  To holding area stable with 24 hours eval.   PROCEDURE IN DETAIL:  The patient was taken peripheral vascular cath lab,  placed supine position where the area of both groins were prepped, draped  usual sterile fashion.  Using local anesthesia and single-wall puncture the  left common femoral artery was entered. The guidewire was passed up the  level suprarenal aorta. A 5-French sheath was passed over the guidewire. The  pigtail catheter was positioned level of the suprarenal aorta. AP projection  at this level revealed two right renal and one left renal artery. There was  moderate stenosis in the renal arteries, proximal right and the left renal  artery. There was some mild ectasia of the iliac arteries. The pigtail  catheters then pulled down to the level of the aortic bifurcation. This  revealed widely patent prior placed common iliac artery stents. There was  some aneurysmal change at the right  common iliac artery at the level of the  prior placed stent, but no flow-limiting stenosis. Runoff revealed patency  of the proximal superficial femoral artery. On the right there was complete  occlusion over approximately 5 cm with a marked collateral formation around  this. The left had irregularity and some stenosis but no complete occlusion  at the superficial femoral artery.  The patient had normal below-knee  popliteal arteries and normal runoff bilaterally.  Decision made to attempt  a recanalized the right distal superficial femoral artery due to limiting  claudication.  A Motarjeme catheter was used to get a guidewire down to the  level of the common femoral artery and a end-hole catheter was positioned  over this. With further manipulation with a Glidewire and prolapsing the  Glidewire upon itself, the complete occlusion was recanalized and the  guidewire was positioned in the popliteal artery below this. The 5-French  sheath and Motarjeme catheter were removed and the end hole catheter was  removed. A 6-French guide sheath was then passed up over the bifurcation  down into the common iliac artery below the  prior placed stent. The patient  was given 5000 units of intravenous heparin. A 6 mm x 8 cm angioplasty  balloon was then passed over the guidewire down to the level of the lesion  at the distal superficial femoral artery. This was inflated to 10  atmospheres with a good result of dilatation of the lesion. Repeat  arteriogram showed recoil at the level of the heavily calcified vessel.  For  this reason, decision was made to place a Smart stent. Approximately 6 to 7  centimeter total area requiring treatment due to the marked calcification of  this prior occlusion of the distal superficial femoral artery. A 7 mm x 8 cm  Esmarch stent was chosen and was positioned at the lesion. This was  deployed. There was some recoil and therefore a 7 mm x 4 cm balloon was  chosen to post  dilate the Smart stent. Completion arteriogram revealed  excellent flow through the stent.  There was blush of extravasation at the  area of the prior occlusion. The balloon was reinflated for a tamponade.  ACT was checked and this was 212. The balloon was then deflated after  approximately 6 minutes of inflation and repeat arteriogram showed near  total resolution of the extravasation. The balloon was reinflated for  additional 4 minutes and a final arteriogram showed excellent result with no  flow-limiting stenosis and no evidence of extravasation.  The patient  tolerated procedure with no complications, was comfortable throughout the  procedure and was transferred to the holding area where the 5-French sheath  was removed and pressure was held for hemostasis.  He will be admitted for  overnight observation. Will continue on Plavix that he had been placed on  and will follow-up with me in 2 weeks.      [name], M.D.  Electronically Signed     TFE/MEDQ  D:  03/21/2005  T:  03/22/2005  Job:  [id number]   cc:   Lyn Records, M.D.  Fax: [phone]

## 2010-05-26 NOTE — Op Note (Signed)
NAME:  [name], [name]                        ACCOUNT NO.:  [id number]   MEDICAL RECORD NO.:  [id number]                   PATIENT TYPE:  AMB   LOCATION:  DAY                                  FACILITY:  [facility]   PHYSICIAN:  Rozanna Boer., M.D.      DATE OF BIRTH:  07-31-1932   DATE OF PROCEDURE:  02/03/2003  DATE OF DISCHARGE:                                 OPERATIVE REPORT   PREOPERATIVE DIAGNOSIS:  Microscopic hematuria in a smoker, phimosis.   POSTOPERATIVE DIAGNOSIS:  Microscopic hematuria in a smoker, phimosis.   OPERATION:  Cysto and circumcision.   ANESTHESIA:  General.   SURGEON:  Courtney Paris, M.D.   BRIEF HISTORY:  This 77 year old patient with phimosis is admitted for  circumcision and also has microscopic hematuria and is a smoker and is to  have a Cysto at this time.  He is a type 2 diabetic, he has a knee operation  in 1969, CABG in 1979, 1984 and 1993 in the left carotid June 2003.  He  enters now for these procedures to be done at [facility] due to his medical  problems instead of the outpatient facility but will be done as an  outpatient.   The patient was placed on the operating table in dorsal lithotomy position  and after satisfactory induction of general anesthesia was prepped and  draped with Betadine in the usual sterile fashion.  A 21 panendoscope was  used to inspect the anterior urethra, no structures were seen. The posterior  urethra was fairly short about 2.5 cm in length somewhat hyperemic and the  bladder was two to possibly early 3 plus trabeculation. There was some maybe  early cellule formation. The bladder had a fairly good capacity but no  evidence of any tumors.  The trigone orifices looked normal.  The scope was  removed after draining the bladder.  The redundant foreskin was then marked  off with a marking pen and a circumferential incision made around this.  Another circumferential incision was made around the coronal  sulcus and the  foreskin was then removed with the Bovie with a fine needle electrode. This  was then used to affect good hemostasis and the edges of the skin were then  reapproximated with interrupted 4-0 chromic catgut sutures  circumferentially. At the end of the procedure, he was blocked with 6 mL of  0.25% plain Marcaine at the base of the penis with postoperative pain  relief.  A dressing of collodion, Vaseline gauze, sterile dry bandage, and  Coban was then applied. The patient was allowed to react from his anesthetic  and returned to recovery room in good condition to be discharged to home  with detailed written instructions.  Rozanna Boer., M.D.    HMK/MEDQ  D:  02/03/2003  T:  02/03/2003  Job:  [id number]

## 2010-06-13 ENCOUNTER — Encounter (HOSPITAL_BASED_OUTPATIENT_CLINIC_OR_DEPARTMENT_OTHER): Payer: Medicare Other | Admitting: Oncology

## 2010-06-13 ENCOUNTER — Other Ambulatory Visit: Payer: Self-pay | Admitting: Oncology

## 2010-06-13 DIAGNOSIS — D649 Anemia, unspecified: Secondary | ICD-10-CM

## 2010-06-13 LAB — CBC WITH DIFFERENTIAL/PLATELET
Eosinophils Absolute: 0.2 10*3/uL (ref 0.0–0.5)
LYMPH%: 22.4 % (ref 14.0–49.0)
MCV: 89.5 fL (ref 79.3–98.0)
MONO%: 8.4 % (ref 0.0–14.0)
NEUT#: 4.1 10*3/uL (ref 1.5–6.5)
NEUT%: 65.5 % (ref 39.0–75.0)
Platelets: 257 10*3/uL (ref 140–400)
RBC: 3.9 10*6/uL — ABNORMAL LOW (ref 4.20–5.82)

## 2010-07-10 ENCOUNTER — Other Ambulatory Visit: Payer: Self-pay | Admitting: Gastroenterology

## 2010-07-10 ENCOUNTER — Ambulatory Visit
Admission: RE | Admit: 2010-07-10 | Discharge: 2010-07-10 | Disposition: A | Discharge status: Home / Self Care | Payer: Medicare Other | Source: Ambulatory Visit | Attending: Gastroenterology | Admitting: Gastroenterology

## 2010-08-21 ENCOUNTER — Encounter (HOSPITAL_COMMUNITY)
Admission: RE | Admit: 2010-08-21 | Discharge: 2010-08-21 | Disposition: A | Discharge status: Home / Self Care | Payer: Medicare Other | Source: Ambulatory Visit | Attending: Orthopedic Surgery | Admitting: Orthopedic Surgery

## 2010-08-21 DIAGNOSIS — Z538 Procedure and treatment not carried out for other reasons: Secondary | ICD-10-CM | POA: Insufficient documentation

## 2010-08-21 DIAGNOSIS — Z01812 Encounter for preprocedural laboratory examination: Secondary | ICD-10-CM | POA: Insufficient documentation

## 2010-08-21 LAB — DIFFERENTIAL
Basophils Relative: 1 % (ref 0–1)
Eosinophils Absolute: 0.2 10*3/uL (ref 0.0–0.7)
Eosinophils Relative: 4 % (ref 0–5)
Monocytes Absolute: 0.5 10*3/uL (ref 0.1–1.0)
Monocytes Relative: 9 % (ref 3–12)
Neutrophils Relative %: 69 % (ref 43–77)

## 2010-08-21 LAB — URINALYSIS, ROUTINE W REFLEX MICROSCOPIC
Glucose, UA: NEGATIVE mg/dL
Ketones, ur: NEGATIVE mg/dL
Protein, ur: NEGATIVE mg/dL
pH: 6.5 (ref 5.0–8.0)

## 2010-08-21 LAB — BASIC METABOLIC PANEL
BUN: 20 mg/dL (ref 6–23)
Chloride: 107 mEq/L (ref 96–112)
GFR calc Af Amer: 45 mL/min — ABNORMAL LOW (ref >60)
Glucose, Bld: 108 mg/dL — ABNORMAL HIGH (ref 70–99)
Potassium: 4.5 mEq/L (ref 3.5–5.1)
Sodium: 141 mEq/L (ref 135–145)

## 2010-08-21 LAB — CBC
MCH: 28.5 pg (ref 26.0–34.0)
MCHC: 32.6 g/dL (ref 30.0–36.0)
Platelets: 208 10*3/uL (ref 150–400)
RDW: 13.8 % (ref 11.5–15.5)

## 2010-08-21 LAB — TYPE AND SCREEN
ABO/RH(D): O POS
Antibody Screen: NEGATIVE

## 2010-08-21 LAB — PROTIME-INR: Prothrombin Time: 14 seconds (ref 11.6–15.2)

## 2010-08-21 LAB — SURGICAL PCR SCREEN: Staphylococcus aureus: NEGATIVE

## 2010-08-30 ENCOUNTER — Inpatient Hospital Stay (HOSPITAL_COMMUNITY): Admission: RE | Admit: 2010-08-30 | Payer: Medicare Other | Source: Ambulatory Visit | Admitting: Orthopedic Surgery

## 2010-10-06 LAB — BASIC METABOLIC PANEL
BUN: 22
CO2: 24
Calcium: 8.5
Chloride: 103
GFR calc Af Amer: >60
GFR calc non Af Amer: 48 — ABNORMAL LOW
Glucose, Bld: 111 — ABNORMAL HIGH
Potassium: 4.2
Potassium: 4.9
Sodium: 136
Sodium: 137

## 2010-10-06 LAB — POCT I-STAT, CHEM 8
BUN: 24 — ABNORMAL HIGH
BUN: 28 — ABNORMAL HIGH
Calcium, Ion: 1.17
Chloride: 105
Chloride: 110
Creatinine, Ser: 1.6 — ABNORMAL HIGH
Glucose, Bld: 92
HCT: 31 — ABNORMAL LOW
Potassium: 4.3
Potassium: 5.1
Sodium: 137

## 2010-10-06 LAB — CK TOTAL AND CKMB (NOT AT ARMC)
CK, MB: 2.2
Relative Index: 1.4
Relative Index: 1.6
Relative Index: 1.7
Total CK: 140

## 2010-10-06 LAB — TROPONIN I
Troponin I: 0.01
Troponin I: 0.01

## 2010-10-06 LAB — CBC
HCT: 29.8 — ABNORMAL LOW
HCT: 33.2 — ABNORMAL LOW
Hemoglobin: 10.9 — ABNORMAL LOW
MCHC: 32.9
Platelets: 220
RBC: 3.52 — ABNORMAL LOW
RDW: 15.9 — ABNORMAL HIGH
WBC: 11.1 — ABNORMAL HIGH

## 2010-10-06 LAB — CROSSMATCH

## 2010-10-06 LAB — CARDIAC PANEL(CRET KIN+CKTOT+MB+TROPI): Relative Index: 1.2

## 2010-10-06 LAB — ABO/RH: ABO/RH(D): O POS

## 2010-10-10 LAB — CBC
HCT: 28.1 — ABNORMAL LOW
Hemoglobin: 9.4 — ABNORMAL LOW
MCHC: 33
MCHC: 33.5
MCV: 94.4
Platelets: 270
Platelets: 296
RBC: 3.51 — ABNORMAL LOW
RDW: 14.7
WBC: 6.7

## 2010-10-10 LAB — CARDIAC PANEL(CRET KIN+CKTOT+MB+TROPI)
CK, MB: 0.8
Relative Index: INVALID
Relative Index: INVALID
Total CK: 57
Total CK: 71
Troponin I: 0.01
Troponin I: 0.01
Troponin I: <0.01

## 2010-10-10 LAB — GLUCOSE, CAPILLARY
Glucose-Capillary: 120 — ABNORMAL HIGH
Glucose-Capillary: 163 — ABNORMAL HIGH
Glucose-Capillary: 163 — ABNORMAL HIGH
Glucose-Capillary: 269 — ABNORMAL HIGH

## 2010-10-10 LAB — COMPREHENSIVE METABOLIC PANEL
ALT: 19
AST: 26
Albumin: 3.5
CO2: 24
Calcium: 8.9
Creatinine, Ser: 1.49
GFR calc Af Amer: 56 — ABNORMAL LOW
Sodium: 140
Total Protein: 5.6 — ABNORMAL LOW

## 2010-10-10 LAB — APTT: aPTT: 32

## 2010-10-13 LAB — BASIC METABOLIC PANEL
Calcium: 8.5
GFR calc non Af Amer: 52 — ABNORMAL LOW
Potassium: 4.1

## 2010-10-13 LAB — CK TOTAL AND CKMB (NOT AT ARMC)
CK, MB: 1.2
Relative Index: INVALID
Total CK: 72

## 2010-10-13 LAB — POCT CARDIAC MARKERS
CKMB, poc: <1 — ABNORMAL LOW
Myoglobin, poc: 65.3
Operator id: 288331

## 2010-10-13 LAB — DIFFERENTIAL
Basophils Absolute: 0
Eosinophils Absolute: 0.1 — ABNORMAL LOW
Eosinophils Relative: 2
Lymphocytes Relative: 23
Monocytes Absolute: 0.5

## 2010-10-13 LAB — COMPREHENSIVE METABOLIC PANEL
ALT: 19
AST: 22
Albumin: 3.6
Chloride: 105
Creatinine, Ser: 1.2
GFR calc Af Amer: >60
Potassium: 4.5
Sodium: 138
Total Bilirubin: 0.8

## 2010-10-13 LAB — CBC
HCT: 27.9 — ABNORMAL LOW
HCT: 30.1 — ABNORMAL LOW
Hemoglobin: 10.2 — ABNORMAL LOW
Hemoglobin: 8.7 — ABNORMAL LOW
Hemoglobin: 9.4 — ABNORMAL LOW
MCHC: 33.4
MCHC: 34.2
MCV: 89.7
MCV: 90.6
MCV: 90.7
RBC: 2.82 — ABNORMAL LOW
RBC: 3.07 — ABNORMAL LOW
RBC: 3.12 — ABNORMAL LOW
RBC: 3.35 — ABNORMAL LOW
WBC: 5.3
WBC: 6.1

## 2010-10-13 LAB — CARDIAC PANEL(CRET KIN+CKTOT+MB+TROPI): CK, MB: 1.2

## 2010-10-13 LAB — LIPID PANEL
Cholesterol: 143
LDL Cholesterol: 79

## 2010-10-13 LAB — HEMOGLOBIN A1C: Hgb A1c MFr Bld: 6.5 — ABNORMAL HIGH

## 2010-10-13 LAB — I-STAT 8, (EC8 V) (CONVERTED LAB)
Chloride: 104
Glucose, Bld: 91
Potassium: 4.6
pCO2, Ven: 48.2
pH, Ven: 7.377 — ABNORMAL HIGH

## 2010-10-13 LAB — APTT: aPTT: 31

## 2010-10-13 LAB — TROPONIN I
Troponin I: 0.02
Troponin I: 0.03

## 2010-10-13 LAB — HEPARIN LEVEL (UNFRACTIONATED): Heparin Unfractionated: 0.6

## 2010-10-24 LAB — DIFFERENTIAL
Basophils Relative: 0
Eosinophils Absolute: 0.3
Monocytes Absolute: 0.4
Monocytes Relative: 7
Neutro Abs: 3.6

## 2010-10-24 LAB — CBC
Hemoglobin: 10 — ABNORMAL LOW
MCHC: 34.1
MCV: 89.9
RBC: 3.27 — ABNORMAL LOW

## 2010-11-09 ENCOUNTER — Encounter (HOSPITAL_COMMUNITY): Payer: Self-pay | Admitting: Pharmacy Technician

## 2010-11-10 ENCOUNTER — Ambulatory Visit (HOSPITAL_COMMUNITY)
Admission: RE | Admit: 2010-11-10 | Discharge: 2010-11-10 | Disposition: A | Discharge status: Home / Self Care | Payer: Medicare Other | Source: Ambulatory Visit | Attending: Orthopedic Surgery | Admitting: Orthopedic Surgery

## 2010-11-10 ENCOUNTER — Encounter (HOSPITAL_COMMUNITY)
Admission: RE | Admit: 2010-11-10 | Discharge: 2010-11-10 | Disposition: A | Discharge status: Home / Self Care | Payer: Medicare Other | Source: Ambulatory Visit | Attending: Orthopedic Surgery | Admitting: Orthopedic Surgery

## 2010-11-10 ENCOUNTER — Encounter (HOSPITAL_COMMUNITY): Payer: Self-pay

## 2010-11-10 DIAGNOSIS — Z01818 Encounter for other preprocedural examination: Secondary | ICD-10-CM | POA: Insufficient documentation

## 2010-11-10 HISTORY — DX: Atherosclerotic heart disease of native coronary artery without angina pectoris: I25.10

## 2010-11-10 HISTORY — DX: Angina pectoris, unspecified: I20.9

## 2010-11-10 HISTORY — DX: Peripheral vascular disease, unspecified: I73.9

## 2010-11-10 HISTORY — DX: Acute myocardial infarction, unspecified: I21.9

## 2010-11-10 HISTORY — DX: Essential (primary) hypertension: I10

## 2010-11-10 HISTORY — DX: Unspecified osteoarthritis, unspecified site: M19.90

## 2010-11-10 HISTORY — DX: Malignant (primary) neoplasm, unspecified: C80.1

## 2010-11-10 HISTORY — DX: Cardiac murmur, unspecified: R01.1

## 2010-11-10 LAB — DIFFERENTIAL
Eosinophils Absolute: 0.1 10*3/uL (ref 0.0–0.7)
Lymphocytes Relative: 21 % (ref 12–46)
Lymphs Abs: 1.3 10*3/uL (ref 0.7–4.0)
Monocytes Relative: 7 % (ref 3–12)
Neutrophils Relative %: 70 % (ref 43–77)

## 2010-11-10 LAB — CBC
Hemoglobin: 12 g/dL — ABNORMAL LOW (ref 13.0–17.0)
MCH: 29.5 pg (ref 26.0–34.0)
Platelets: 250 10*3/uL (ref 150–400)
RBC: 4.07 MIL/uL — ABNORMAL LOW (ref 4.22–5.81)
WBC: 6.3 10*3/uL (ref 4.0–10.5)

## 2010-11-10 LAB — SURGICAL PCR SCREEN
MRSA, PCR: NEGATIVE
Staphylococcus aureus: NEGATIVE

## 2010-11-10 LAB — BASIC METABOLIC PANEL
CO2: 29 mEq/L (ref 19–32)
Calcium: 9.5 mg/dL (ref 8.4–10.5)
Chloride: 103 mEq/L (ref 96–112)
Creatinine, Ser: 2.07 mg/dL — ABNORMAL HIGH (ref 0.50–1.35)
Glucose, Bld: 164 mg/dL — ABNORMAL HIGH (ref 70–99)
Sodium: 140 mEq/L (ref 135–145)

## 2010-11-10 LAB — URINALYSIS, ROUTINE W REFLEX MICROSCOPIC
Bilirubin Urine: NEGATIVE
Ketones, ur: NEGATIVE mg/dL
Leukocytes, UA: NEGATIVE
Nitrite: NEGATIVE
Protein, ur: NEGATIVE mg/dL

## 2010-11-10 LAB — PROTIME-INR: INR: 1.03 (ref 0.00–1.49)

## 2010-11-10 NOTE — Pre-Procedure Instructions (Signed)
20 OLAJUWON FOSDICK  11/10/2010   Your procedure is scheduled ZO:[id number] 11/20/10    Report to [facility] [id number] AM.  Call this number if you have problems the morning of surgery: [phone]   Remember:   Do not eat food:After Midnight.  Do not drink clear liquids: 4 Hours before arrival.  Take these medicines the morning of surgery with A SIP OF WATER: AMLODIPINE, IMDUR, METOPROLOL   Do not wear jewelry, make-up or nail polish.  Do not wear lotions, powders, or perfumes. You may wear deodorant.  Do not shave 48 hours prior to surgery.  Do not bring valuables to the hospital.  Contacts, dentures or bridgework may not be worn into surgery.  Leave suitcase in the car. After surgery it may be brought to your room.  For patients admitted to the hospital, checkout time is 11:00 AM the day of discharge.   Patients discharged the day of surgery will not be allowed to drive home.  Name and phone number of your driver: CAROLYN  Boback   Special Instructions: CHG Shower Use Special Wash: 1/2 bottle night before surgery and 1/2 bottle morning of surgery.   Please read over the following fact sheets that you were given: Pain Booklet, MRSA Information and Surgical Site Infection Prevention

## 2010-11-15 NOTE — H&P (Signed)
  Subjective: Patient is admitted for right total knee arthroplasty.  Patient is a 77 y.o. male presented with a history of pain in the right knee. Onset of symptoms was gradual starting several months ago with gradually worsening course since that time. The patient noted no past surgery on the right knee.  Patient has been treated conservatively with over-the-counter NSAIDs and activity modification. Patient currently rates pain in the knee at 8 out of 10 with activity. There is pain at night.  There are no active problems to display for this patient.  Past Medical History  Diagnosis Date  . Coronary artery disease   . Angina   . Hypertension   . Heart murmur   . Myocardial infarction     PT SEES DR SMITH REQ STRESS, ECHO  . Peripheral vascular disease     PT HAS 2 STENTS  . Diabetes mellitus     BORDERLINE  NO CURRENT MEDS  . Cancer     SKIN CANCERS REMOVED   . Arthritis     DJD    Past Surgical History  Procedure Date  . Joint replacement     RIGHT KNEE   . Coronary artery bypass graft     79/84/93 CABG 95 CHEST HERNIA  . Cardiac catheterization   . Wrist surgery 2004    LEFT WRIST   . Hernia repair     CHEST AND HIATAL HERNIA SURGERY  . Cataract extraction, bilateral     No prescriptions prior to admission   Allergies  Allergen Reactions  . Contrast Media (Iodinated Diagnostic Agents) Hives    History  Substance Use Topics  . Smoking status: Former Games developer  . Smokeless tobacco: Not on file  . Alcohol Use: No    No family history on file.  Review of Systems A comprehensive review of systems was negative except for: Musculoskeletal: positive for arthralgias  Objective: Vital signs in last 24 hours:  The patient is a well-developed and well-nourished 77 year old gentleman who is alert and oriented and in no acute distress.  Examination of the right knee demonstrates range of motion to be 5 to 110 degrees.  He is diffusely tender to palpation along the medial  joint line and nontender to palpation laterally.  Neurovascular exam is within normal limits.   Imaging Review Plain radiographs demonstrate severe degenerative joint disease of the right knee. The overall alignment is significant varus. The bone quality appears to be good for age and reported activity level.  Assessment/Plan: End stage arthritis, right knee   The patient history, physical examination and imaging studies are consistent with advanced degenerative joint disease of the right knee. The treatment options including medical management, injection therapy arthroscopy and arthroplasty were discussed at length. The risks and benefits of total knee arthroplasty were presented and reviewed. The risks due to aseptic loosening, infection, stiffness, patella tracking problems, thromboembolic complications among others were discussed. The patient acknowledged the explanation, agreed to proceed with the plan and a consent was signed.

## 2010-11-16 ENCOUNTER — Other Ambulatory Visit: Payer: Self-pay | Admitting: Orthopedic Surgery

## 2010-11-17 NOTE — Consult Note (Signed)
Anesthesia:  Patient is a 77 year old male for right TKA on 11/20/10.  He has a complex cardiac history having undergone 3 previous CABG (1979, 1984, 1993) and stents in 2006 and 2007.  Dr. Garnette Scheuermann is his Cardiologist who saw him for preoperative evaluation.  His note states that "basically 1 blood vessel (LIMA graft) supplying the entire heart."  He was cleared but felt high risk (>5%) for CV complication.  His last EKG 06/13/10, stress with EF 52%  09/21/09, and cath 12/02/07 are on his chart.  According to notes, he has not used Nitroglycerin in over 8 months and has been on Ranexa.  His other co-morbidities include HTN, PVD, DM Type II, OA, former smoker, skin cancer, ECCVOD s/p bilateral carotid endarterectomies, and CKD Stage III (moderate).  His preoperative Cr was 2.07.  Over the past year his Cr has ranged from 1.58 - 2.27, most recently 1.8 in August.  Will plan to repeat BMET DOS. I did review this patient with Dr. Noreene Larsson.  Plan to proceed if no new CV symptoms and labs stable.

## 2010-11-19 DIAGNOSIS — M1711 Unilateral primary osteoarthritis, right knee: Secondary | ICD-10-CM | POA: Diagnosis present

## 2010-11-20 ENCOUNTER — Inpatient Hospital Stay (HOSPITAL_COMMUNITY): Payer: Medicare Other | Admitting: Vascular Surgery

## 2010-11-20 ENCOUNTER — Encounter (HOSPITAL_COMMUNITY)
Admission: RE | Disposition: A | Discharge status: Home Health | Payer: Self-pay | Source: Ambulatory Visit | Attending: Orthopedic Surgery

## 2010-11-20 ENCOUNTER — Encounter (HOSPITAL_COMMUNITY): Payer: Self-pay | Admitting: Vascular Surgery

## 2010-11-20 ENCOUNTER — Encounter (HOSPITAL_COMMUNITY): Payer: Self-pay | Admitting: *Deleted

## 2010-11-20 ENCOUNTER — Inpatient Hospital Stay (HOSPITAL_COMMUNITY)
Admission: RE | Admit: 2010-11-20 | Discharge: 2010-11-23 | DRG: 470 | Disposition: A | Discharge status: Home Health | Payer: Medicare Other | Source: Ambulatory Visit | Attending: Orthopedic Surgery | Admitting: Orthopedic Surgery

## 2010-11-20 DIAGNOSIS — M1990 Unspecified osteoarthritis, unspecified site: Secondary | ICD-10-CM | POA: Diagnosis present

## 2010-11-20 DIAGNOSIS — Z85828 Personal history of other malignant neoplasm of skin: Secondary | ICD-10-CM

## 2010-11-20 DIAGNOSIS — Z79899 Other long term (current) drug therapy: Secondary | ICD-10-CM

## 2010-11-20 DIAGNOSIS — Z951 Presence of aortocoronary bypass graft: Secondary | ICD-10-CM

## 2010-11-20 DIAGNOSIS — I252 Old myocardial infarction: Secondary | ICD-10-CM

## 2010-11-20 DIAGNOSIS — Z7901 Long term (current) use of anticoagulants: Secondary | ICD-10-CM

## 2010-11-20 DIAGNOSIS — I739 Peripheral vascular disease, unspecified: Secondary | ICD-10-CM | POA: Diagnosis present

## 2010-11-20 DIAGNOSIS — Z91041 Radiographic dye allergy status: Secondary | ICD-10-CM

## 2010-11-20 DIAGNOSIS — Z87891 Personal history of nicotine dependence: Secondary | ICD-10-CM

## 2010-11-20 DIAGNOSIS — Z9849 Cataract extraction status, unspecified eye: Secondary | ICD-10-CM

## 2010-11-20 DIAGNOSIS — M1711 Unilateral primary osteoarthritis, right knee: Secondary | ICD-10-CM

## 2010-11-20 DIAGNOSIS — I10 Essential (primary) hypertension: Secondary | ICD-10-CM | POA: Diagnosis present

## 2010-11-20 DIAGNOSIS — M1710 Unilateral primary osteoarthritis, unspecified knee: Principal | ICD-10-CM | POA: Diagnosis present

## 2010-11-20 DIAGNOSIS — Z96659 Presence of unspecified artificial knee joint: Secondary | ICD-10-CM

## 2010-11-20 DIAGNOSIS — I2510 Atherosclerotic heart disease of native coronary artery without angina pectoris: Secondary | ICD-10-CM | POA: Diagnosis present

## 2010-11-20 HISTORY — PX: TOTAL KNEE ARTHROPLASTY: SHX125

## 2010-11-20 LAB — GLUCOSE, CAPILLARY: Glucose-Capillary: 121 mg/dL — ABNORMAL HIGH (ref 70–99)

## 2010-11-20 SURGERY — ARTHROPLASTY, KNEE, TOTAL
Anesthesia: Regional | Site: Knee | Laterality: Right | Wound class: Clean

## 2010-11-20 MED ORDER — FLEET ENEMA 7-19 GM/118ML RE ENEM: 1.0000 | ENEMA | Freq: Every day | RECTAL | Status: DC | PRN

## 2010-11-20 MED ORDER — BISACODYL 10 MG RE SUPP: 10.0000 mg | Freq: Every day | RECTAL | Status: DC | PRN

## 2010-11-20 MED ORDER — WARFARIN VIDEO
Freq: Once | Status: AC
  Administered 2010-11-21: 12:00:00
  Filled 2010-11-20: qty 1

## 2010-11-20 MED ORDER — LACTATED RINGERS IV SOLN
INTRAVENOUS | Status: DC
  Administered 2010-11-20 (×2): via INTRAVENOUS

## 2010-11-20 MED ORDER — CEFUROXIME SODIUM 1.5 G IJ SOLR
INTRAMUSCULAR | Status: DC | PRN
  Administered 2010-11-20: 1.5 g

## 2010-11-20 MED ORDER — MAGNESIUM HYDROXIDE 400 MG/5ML PO SUSP: 30.0000 mL | Freq: Two times a day (BID) | ORAL | Status: DC | PRN

## 2010-11-20 MED ORDER — CEFAZOLIN SODIUM 1-5 GM-% IV SOLN
1.0000 g | Freq: Once | INTRAVENOUS | Status: AC
  Administered 2010-11-20 (×2): 1 g via INTRAVENOUS
  Filled 2010-11-20: qty 50

## 2010-11-20 MED ORDER — FENTANYL CITRATE 0.05 MG/ML IJ SOLN
25.0000 ug | INTRAMUSCULAR | Status: DC | PRN
  Administered 2010-11-20: 50 ug via INTRAVENOUS

## 2010-11-20 MED ORDER — THERA M PLUS PO TABS
1.0000 | ORAL_TABLET | Freq: Every day | ORAL | Status: DC
  Administered 2010-11-21 – 2010-11-23 (×3): 1 via ORAL
  Filled 2010-11-20 (×5): qty 1

## 2010-11-20 MED ORDER — EPHEDRINE SULFATE 50 MG/ML IJ SOLN
INTRAMUSCULAR | Status: DC | PRN
  Administered 2010-11-20: 10 mg via INTRAVENOUS

## 2010-11-20 MED ORDER — HYDROMORPHONE HCL PF 1 MG/ML IJ SOLN
INTRAMUSCULAR | Status: AC
  Filled 2010-11-20: qty 1

## 2010-11-20 MED ORDER — MIDAZOLAM HCL 5 MG/5ML IJ SOLN
INTRAMUSCULAR | Status: DC | PRN
  Administered 2010-11-20 (×2): 1 mg via INTRAVENOUS

## 2010-11-20 MED ORDER — MEPERIDINE HCL 25 MG/ML IJ SOLN: 6.2500 mg | INTRAMUSCULAR | Status: DC | PRN

## 2010-11-20 MED ORDER — METOCLOPRAMIDE HCL 10 MG PO TABS: 5.0000 mg | ORAL_TABLET | Freq: Three times a day (TID) | ORAL | Status: DC | PRN

## 2010-11-20 MED ORDER — AMLODIPINE BESYLATE 5 MG PO TABS
5.0000 mg | ORAL_TABLET | Freq: Every day | ORAL | Status: DC
  Administered 2010-11-20 – 2010-11-23 (×4): 5 mg via ORAL
  Filled 2010-11-20 (×4): qty 1

## 2010-11-20 MED ORDER — ONDANSETRON HCL 4 MG/2ML IJ SOLN: 4.0000 mg | Freq: Once | INTRAMUSCULAR | Status: DC | PRN

## 2010-11-20 MED ORDER — FENTANYL CITRATE 0.05 MG/ML IJ SOLN
INTRAMUSCULAR | Status: AC
  Filled 2010-11-20: qty 2

## 2010-11-20 MED ORDER — CALCIUM CARBONATE-VITAMIN D 500-200 MG-UNIT PO TABS
1.0000 | ORAL_TABLET | Freq: Two times a day (BID) | ORAL | Status: DC
  Administered 2010-11-20 – 2010-11-21 (×3): 1 via ORAL
  Administered 2010-11-22: 11:00:00 via ORAL
  Administered 2010-11-22 – 2010-11-23 (×2): 1 via ORAL
  Filled 2010-11-20 (×8): qty 1

## 2010-11-20 MED ORDER — ENOXAPARIN SODIUM 30 MG/0.3ML ~~LOC~~ SOLN
30.0000 mg | Freq: Two times a day (BID) | SUBCUTANEOUS | Status: DC
  Administered 2010-11-21 (×2): 30 mg via SUBCUTANEOUS
  Filled 2010-11-20 (×6): qty 0.3

## 2010-11-20 MED ORDER — BISACODYL 5 MG PO TBEC: 10.0000 mg | DELAYED_RELEASE_TABLET | Freq: Every day | ORAL | Status: DC | PRN

## 2010-11-20 MED ORDER — SODIUM CHLORIDE 0.9 % IR SOLN
Status: DC | PRN
  Administered 2010-11-20: 3000 mL

## 2010-11-20 MED ORDER — METHOCARBAMOL 100 MG/ML IJ SOLN
500.0000 mg | Freq: Once | INTRAVENOUS | Status: AC
  Administered 2010-11-20: 500 mg via INTRAVENOUS
  Filled 2010-11-20: qty 5

## 2010-11-20 MED ORDER — BUPIVACAINE HCL 0.75 % IJ SOLN
INTRAMUSCULAR | Status: DC | PRN
  Administered 2010-11-20: 12 mg

## 2010-11-20 MED ORDER — OXYCODONE-ACETAMINOPHEN 5-325 MG PO TABS
1.0000 | ORAL_TABLET | ORAL | Status: DC | PRN
  Administered 2010-11-20 – 2010-11-21 (×2): 2 via ORAL
  Filled 2010-11-20 (×2): qty 2

## 2010-11-20 MED ORDER — RANOLAZINE ER 500 MG PO TB12
500.0000 mg | ORAL_TABLET | Freq: Two times a day (BID) | ORAL | Status: DC
  Administered 2010-11-20 – 2010-11-23 (×6): 500 mg via ORAL
  Filled 2010-11-20 (×8): qty 1

## 2010-11-20 MED ORDER — METOPROLOL TARTRATE 50 MG PO TABS
50.0000 mg | ORAL_TABLET | Freq: Every day | ORAL | Status: DC
  Administered 2010-11-20 – 2010-11-23 (×4): 50 mg via ORAL
  Filled 2010-11-20 (×4): qty 1

## 2010-11-20 MED ORDER — ISOSORBIDE MONONITRATE ER 30 MG PO TB24
120.0000 mg | ORAL_TABLET | Freq: Every day | ORAL | Status: DC
  Administered 2010-11-20 – 2010-11-23 (×4): 120 mg via ORAL
  Filled 2010-11-20 (×4): qty 4

## 2010-11-20 MED ORDER — FENTANYL CITRATE 0.05 MG/ML IJ SOLN: 50.0000 ug | INTRAMUSCULAR | Status: DC | PRN

## 2010-11-20 MED ORDER — ONDANSETRON HCL 4 MG/2ML IJ SOLN
INTRAMUSCULAR | Status: DC | PRN
  Administered 2010-11-20: 4 mg via INTRAVENOUS

## 2010-11-20 MED ORDER — PHENOL 1.4 % MT LIQD
1.0000 | OROMUCOSAL | Status: DC | PRN
  Filled 2010-11-20: qty 177

## 2010-11-20 MED ORDER — ACETAMINOPHEN 325 MG PO TABS: 650.0000 mg | ORAL_TABLET | Freq: Four times a day (QID) | ORAL | Status: DC | PRN

## 2010-11-20 MED ORDER — ONDANSETRON HCL 4 MG/2ML IJ SOLN
4.0000 mg | Freq: Four times a day (QID) | INTRAMUSCULAR | Status: DC | PRN
  Administered 2010-11-21: 4 mg via INTRAVENOUS
  Filled 2010-11-20: qty 2

## 2010-11-20 MED ORDER — METHOCARBAMOL 100 MG/ML IJ SOLN
500.0000 mg | Freq: Four times a day (QID) | INTRAVENOUS | Status: DC | PRN
  Filled 2010-11-20: qty 5

## 2010-11-20 MED ORDER — MULTIVITAMINS PO TABS: 1.0000 | ORAL_TABLET | Freq: Every day | ORAL | Status: DC

## 2010-11-20 MED ORDER — PROPOFOL 10 MG/ML IV EMUL
INTRAVENOUS | Status: DC | PRN
  Administered 2010-11-20: 50 ug/kg/min via INTRAVENOUS

## 2010-11-20 MED ORDER — HYDROMORPHONE HCL PF 1 MG/ML IJ SOLN
0.5000 mg | INTRAMUSCULAR | Status: DC | PRN
  Administered 2010-11-20: 0.5 mg via INTRAVENOUS
  Administered 2010-11-20: 1 mg via INTRAVENOUS
  Administered 2010-11-20: 0.5 mg via INTRAVENOUS
  Filled 2010-11-20: qty 1

## 2010-11-20 MED ORDER — ALUM & MAG HYDROXIDE-SIMETH 200-200-20 MG/5ML PO SUSP: 30.0000 mL | ORAL | Status: DC | PRN

## 2010-11-20 MED ORDER — LACTATED RINGERS IV SOLN
INTRAVENOUS | Status: DC
  Administered 2010-11-20: 19:00:00 via INTRAVENOUS

## 2010-11-20 MED ORDER — KCL IN DEXTROSE-NACL 20-5-0.45 MEQ/L-%-% IV SOLN
INTRAVENOUS | Status: DC
  Administered 2010-11-20: 22:00:00 via INTRAVENOUS
  Administered 2010-11-21: 125 mL/h via INTRAVENOUS
  Filled 2010-11-20 (×9): qty 1000

## 2010-11-20 MED ORDER — BUPIVACAINE-EPINEPHRINE PF 0.5-1:200000 % IJ SOLN
INTRAMUSCULAR | Status: DC | PRN
  Administered 2010-11-20: 30 mL

## 2010-11-20 MED ORDER — METOCLOPRAMIDE HCL 5 MG/ML IJ SOLN
5.0000 mg | Freq: Three times a day (TID) | INTRAMUSCULAR | Status: DC | PRN
  Filled 2010-11-20: qty 2

## 2010-11-20 MED ORDER — ACETAMINOPHEN 650 MG RE SUPP: 650.0000 mg | Freq: Four times a day (QID) | RECTAL | Status: DC | PRN

## 2010-11-20 MED ORDER — WARFARIN SODIUM 7.5 MG PO TABS
7.5000 mg | ORAL_TABLET | Freq: Once | ORAL | Status: AC
  Administered 2010-11-20: 7.5 mg via ORAL
  Filled 2010-11-20: qty 1

## 2010-11-20 MED ORDER — POLYETHYLENE GLYCOL 3350 17 G PO PACK
17.0000 g | PACK | Freq: Every day | ORAL | Status: DC | PRN
  Administered 2010-11-22: 17 g via ORAL
  Filled 2010-11-20 (×2): qty 1

## 2010-11-20 MED ORDER — DOCUSATE SODIUM 100 MG PO CAPS
100.0000 mg | ORAL_CAPSULE | Freq: Two times a day (BID) | ORAL | Status: DC
  Administered 2010-11-21 – 2010-11-23 (×5): 100 mg via ORAL
  Filled 2010-11-20 (×8): qty 1

## 2010-11-20 MED ORDER — ZOLPIDEM TARTRATE 5 MG PO TABS: 5.0000 mg | ORAL_TABLET | Freq: Every evening | ORAL | Status: DC | PRN

## 2010-11-20 MED ORDER — DIPHENHYDRAMINE HCL 12.5 MG/5ML PO ELIX
12.5000 mg | ORAL_SOLUTION | ORAL | Status: DC | PRN
  Filled 2010-11-20: qty 10

## 2010-11-20 MED ORDER — MIDAZOLAM HCL 2 MG/2ML IJ SOLN: 1.0000 mg | INTRAMUSCULAR | Status: DC | PRN

## 2010-11-20 MED ORDER — HYDROCODONE-ACETAMINOPHEN 5-325 MG PO TABS
1.0000 | ORAL_TABLET | ORAL | Status: DC | PRN
  Administered 2010-11-20 – 2010-11-22 (×7): 2 via ORAL
  Filled 2010-11-20 (×7): qty 2

## 2010-11-20 MED ORDER — SIMVASTATIN 40 MG PO TABS
40.0000 mg | ORAL_TABLET | Freq: Every day | ORAL | Status: DC
  Administered 2010-11-20 – 2010-11-22 (×3): 40 mg via ORAL
  Filled 2010-11-20 (×3): qty 1

## 2010-11-20 MED ORDER — PATIENT'S GUIDE TO USING COUMADIN BOOK
Freq: Once | Status: AC
  Administered 2010-11-20: 22:00:00
  Filled 2010-11-20: qty 1

## 2010-11-20 MED ORDER — ONDANSETRON HCL 4 MG PO TABS: 4.0000 mg | ORAL_TABLET | Freq: Four times a day (QID) | ORAL | Status: DC | PRN

## 2010-11-20 MED ORDER — FENTANYL CITRATE 0.05 MG/ML IJ SOLN
INTRAMUSCULAR | Status: DC | PRN
  Administered 2010-11-20 (×3): 25 ug via INTRAVENOUS
  Administered 2010-11-20: 50 ug via INTRAVENOUS
  Administered 2010-11-20: 25 ug via INTRAVENOUS

## 2010-11-20 MED ORDER — METHOCARBAMOL 500 MG PO TABS
500.0000 mg | ORAL_TABLET | Freq: Four times a day (QID) | ORAL | Status: DC | PRN
  Administered 2010-11-21 – 2010-11-22 (×4): 500 mg via ORAL
  Filled 2010-11-20 (×5): qty 1

## 2010-11-20 MED ORDER — MENTHOL 3 MG MT LOZG: 1.0000 | LOZENGE | OROMUCOSAL | Status: DC | PRN

## 2010-11-20 SURGICAL SUPPLY — 53 items
BANDAGE ESMARK 6X9 LF (GAUZE/BANDAGES/DRESSINGS) ×1 IMPLANT
BLADE SAG 18X100X1.27 (BLADE) ×2 IMPLANT
BLADE SAW SGTL 13X75X1.27 (BLADE) ×2 IMPLANT
BLADE SURG ROTATE 9660 (MISCELLANEOUS) IMPLANT
BNDG ELASTIC 6X10 VLCR STRL LF (GAUZE/BANDAGES/DRESSINGS) ×2 IMPLANT
BNDG ESMARK 6X9 LF (GAUZE/BANDAGES/DRESSINGS) ×2
BOWL SMART MIX CTS (DISPOSABLE) ×2 IMPLANT
CEMENT HV SMART SET (Cement) ×4 IMPLANT
CLOTH BEACON ORANGE TIMEOUT ST (SAFETY) ×2 IMPLANT
COVER BACK TABLE 24X17X13 BIG (DRAPES) IMPLANT
COVER SURGICAL LIGHT HANDLE (MISCELLANEOUS) ×2 IMPLANT
CUFF TOURNIQUET SINGLE 34IN LL (TOURNIQUET CUFF) ×2 IMPLANT
CUFF TOURNIQUET SINGLE 44IN (TOURNIQUET CUFF) IMPLANT
DRAPE EXTREMITY T 121X128X90 (DRAPE) ×2 IMPLANT
DRAPE U-SHAPE 47X51 STRL (DRAPES) ×2 IMPLANT
DURAPREP 26ML APPLICATOR (WOUND CARE) ×2 IMPLANT
ELECT REM PT RETURN 9FT ADLT (ELECTROSURGICAL) ×2
ELECTRODE REM PT RTRN 9FT ADLT (ELECTROSURGICAL) ×1 IMPLANT
EVACUATOR 1/8 PVC DRAIN (DRAIN) ×2 IMPLANT
GAUZE XEROFORM 1X8 LF (GAUZE/BANDAGES/DRESSINGS) ×2 IMPLANT
GLOVE BIO SURGEON STRL SZ7 (GLOVE) ×2 IMPLANT
GLOVE BIO SURGEON STRL SZ7.5 (GLOVE) ×2 IMPLANT
GLOVE BIOGEL PI IND STRL 7.0 (GLOVE) ×1 IMPLANT
GLOVE BIOGEL PI IND STRL 8 (GLOVE) ×1 IMPLANT
GLOVE BIOGEL PI INDICATOR 7.0 (GLOVE) ×1
GLOVE BIOGEL PI INDICATOR 8 (GLOVE) ×1
GOWN PREVENTION PLUS XLARGE (GOWN DISPOSABLE) ×2 IMPLANT
GOWN STRL NON-REIN LRG LVL3 (GOWN DISPOSABLE) ×4 IMPLANT
HANDPIECE INTERPULSE COAX TIP (DISPOSABLE) ×1
HOOD PEEL AWAY FACE SHEILD DIS (HOOD) ×4 IMPLANT
KIT BASIN OR (CUSTOM PROCEDURE TRAY) ×2 IMPLANT
KIT ROOM TURNOVER OR (KITS) ×2 IMPLANT
MANIFOLD NEPTUNE II (INSTRUMENTS) ×2 IMPLANT
NS IRRIG 1000ML POUR BTL (IV SOLUTION) ×2 IMPLANT
PACK TOTAL JOINT (CUSTOM PROCEDURE TRAY) ×2 IMPLANT
PAD ARMBOARD 7.5X6 YLW CONV (MISCELLANEOUS) ×2 IMPLANT
PADDING CAST COTTON 6X4 STRL (CAST SUPPLIES) ×2 IMPLANT
SET HNDPC FAN SPRY TIP SCT (DISPOSABLE) ×1 IMPLANT
SPONGE GAUZE 4X4 12PLY (GAUZE/BANDAGES/DRESSINGS) ×4 IMPLANT
STAPLER VISISTAT 35W (STAPLE) ×2 IMPLANT
SUCTION FRAZIER TIP 10 FR DISP (SUCTIONS) ×2 IMPLANT
SURGIFLO TRUKIT (HEMOSTASIS) IMPLANT
SUT VIC AB 0 CTX 36 (SUTURE) ×1
SUT VIC AB 0 CTX36XBRD ANTBCTR (SUTURE) ×1 IMPLANT
SUT VIC AB 1 CTX 36 (SUTURE) ×2
SUT VIC AB 1 CTX36XBRD ANBCTR (SUTURE) ×1 IMPLANT
SUT VIC AB 2-0 CT1 27 (SUTURE) ×1
SUT VIC AB 2-0 CT1 TAPERPNT 27 (SUTURE) ×1 IMPLANT
TOWEL OR 17X24 6PK STRL BLUE (TOWEL DISPOSABLE) ×2 IMPLANT
TOWEL OR 17X26 10 PK STRL BLUE (TOWEL DISPOSABLE) ×2 IMPLANT
TRAY FOLEY CATH 14FR (SET/KITS/TRAYS/PACK) ×2 IMPLANT
WATER STERILE IRR 1000ML POUR (IV SOLUTION) ×6 IMPLANT
YANKAUER SUCT BULB TIP NO VENT (SUCTIONS) ×2 IMPLANT

## 2010-11-20 NOTE — Preoperative (Addendum)
Beta Blockers   Reason not to administer Beta Blockers:Not Applicable 

## 2010-11-20 NOTE — Anesthesia Preprocedure Evaluation (Addendum)
Anesthesia Evaluation  Patient identified by MRN, date of birth, ID band Patient awake    Reviewed: Allergy & Precautions, H&P , NPO status , Patient's Chart, lab work & pertinent test results  History of Anesthesia Complications Negative for: history of anesthetic complications  Airway Mallampati: I  Neck ROM: Full    Dental  (+) Edentulous Upper, Edentulous Lower and Dental Advisory Given   Pulmonary COPDformer smoker clear to auscultation        Cardiovascular hypertension, Pt. on medications + angina + CAD, + Past MI and + CABG III+ Valvular Problems/Murmurs Regular Normal    Neuro/Psych    GI/Hepatic GERD-  ,  Endo/Other  Diabetes mellitus-  Renal/GU Renal InsufficiencyRenal disease     Musculoskeletal negative musculoskeletal ROS (+)   Abdominal   Peds  Hematology negative hematology ROS (+)   Anesthesia Other Findings   Reproductive/Obstetrics                         Anesthesia Physical Anesthesia Plan  ASA: III  Anesthesia Plan: Spinal   Post-op Pain Management: MAC Combined w/ Regional for Post-op pain   Induction:   Airway Management Planned: Simple Face Mask  Additional Equipment:   Intra-op Plan:   Post-operative Plan:   Informed Consent: I have reviewed the patients History and Physical, chart, labs and discussed the procedure including the risks, benefits and alternatives for the proposed anesthesia with the patient or authorized representative who has indicated his/her understanding and acceptance.   Dental advisory given  Plan Discussed with: CRNA and Surgeon  Anesthesia Plan Comments:         Anesthesia Quick Evaluation

## 2010-11-20 NOTE — Brief Op Note (Deleted)
11/20/2010  11:11 AM  PATIENT:  [name]  77 y.o. male  PRE-OPERATIVE DIAGNOSIS:  DEGENERATIVE JOINT DISEASE  [name]-OPERATIVE DIAGNOSIS:  * No [name]-op diagnosis entered *  PROCEDURE:  Procedure(s): TOTAL KNEE ARTHROPLASTY  SURGEON:  Surgeon(s): Nestor Lewandowsky  PHYSICIAN ASSISTANT: Mauricia Area PA-C  ASSISTANTS: none   ANESTHESIA:   general  EBL:     BLOOD ADMINISTERED:none  DRAINS: Urinary Catheter (Foley)   LOCAL MEDICATIONS USED:  MARCAINE 20CC  SPECIMEN:  No Specimen  DISPOSITION OF SPECIMEN:  N/A  COUNTS:  YES  TOURNIQUET:  N/A  DICTATION: .Note written in EPIC  PLAN OF CARE: Admit to inpatient   PATIENT DISPOSITION:  PACU - hemodynamically stable.   Delay start of Pharmacological VTE agent (>24hrs) due to surgical blood loss or risk of bleeding:NO

## 2010-11-20 NOTE — Op Note (Deleted)
OPERATIVE REPORT    DATE OF PROCEDURE:  11/20/2010       PREOPERATIVE DIAGNOSIS:  DEGENERATIVE JOINT DISEASE                                                       There is no height or weight on file to calculate BMI.     POSTOPERATIVE DIAGNOSIS:  * No post-op diagnosis entered *                                                           PROCEDURE:  L total hip arthroplasty using a 52 mm DePuy Pinnacle  Cup, [employer], 10-degree polyethylene liner index superior  and posterior, a +0 36 mm ceramic head, a #[id number] SROM stem, 18Dlg Cone   SURGEON: Averi Cacioppo J    ASSISTANT:   Mauricia Area, PA-C  (present throughout entire procedure and necessary for timely completion of the procedure)   ANESTHESIA:  Anesthesia type not filed in the log.  BLOOD LOSS: * No blood loss amount entered *  FLUID REPLACEMENT: 1800 crystalloid DRAINS: Foley Catheter URINE OUTPUT: 300 COMPLICATIONS:  None    INDICATIONS FOR PROCEDURE: A 77 y.o. year-old @GENDER @  With  DEGENERATIVE JOINT DISEASE   for 3-4 years, x-rays show bone-on-bone arthritic changes. Despite conservative measures with observation, anti-inflammatory medicine, narcotics, use of a cane, has severe unremitting pain and can ambulate only a few blocks before resting.  Patient desires elective L total hip arthroplasty to decrease pain and increase function. The risks, benefits, and alternatives were discussed at length including but not limited to the risks of infection, bleeding, nerve injury, stiffness, blood clots, the need for revision surgery, cardiopulmonary complications, among others, and they were willing to proceed.y have been discussed. Questions answered.     PROCEDURE IN DETAIL: The patient was identified by armband,  received preoperative IV antibiotics in the holding area at [facility], taken to the operating room , appropriate anesthetic monitors  were attached and general endotracheal anesthesia  induced. Foley catheter was inserted. He was rolled into the R lateral decubitus position and fixed there with a Stulberg Mark II pelvic clamp and the L lower extremity was then prepped and draped  in the usual sterile fashion from the ankle to the hemipelvis. A time-out  procedure was performed. The skin along the lateral hip and thigh  infiltrated with 10 mL of 0.5% Marcaine and epinephrine solution. We  then made a posterolateral approach to the hip. With a #10 blade, 18 cm  incision through skin and subcutaneous tissue down to the level of the  IT band. Small bleeders were identified and cauterized. IT band cut in  line with skin incision exposing the greater trochanter. A Cobra retractor was placed between the gluteus minimus and the superior hip joint capsule, and a spiked Cobra between the quadratus femoris and the inferior hip joint capsule. This isolated the short  external rotators and piriformis tendons. These were tagged with a #2 Ethibond  suture and cut off their insertion on the intertrochanteric crest. The posterior  capsule was then developed into an acetabular-based  flap from Posterior Superior off of the acetabulum out over the femoral neck and back posterior inferior to the acetabular rim. This flap was tagged with two #2 Ethibond sutures and retracted protecting the sciatic nerve. This exposed the arthritic femoral head and osteophytes. The hip was then flexed and internally rotated, dislocating the femoral head and a standard neck cut performed 1 fingerbreadth above the lesser trochanter.  A spiked Cobra was placed in the cotyloid notch and a Hohmann retractor was then used to lever the femur anteriorly off of the anterior pelvic column. A posterior-inferior wing retractor was placed at the junction of the acetabulum and the ischium completing the acetabular exposure.We then removed the peripheral osteophytes and labrum from the acetabulum. We then reamed the acetabulum up to 51 mm  with basket reamers obtaining good coverage in all quadrants, irrigated out with normal  saline solution and hammered into place a 52 mm pinnacle cup in 45  degrees of abduction and about 20 degrees of anteversion. More  peripheral osteophytes removed and a trial 10-degree liner placed with the  index superior-posterior. The hip was then flexed and internally rotated exposing the  proximal femur, which was entered with the initiating reamer followed by  the axial reamers up to a 13.5 mm full depth and 14mm partial depth. We then conically reamed to 18D to the correct depth for a 42 base neck. The calcar was milled to Lg. A trial cone and stem was inserted in the 25 degrees anteversion, with a +0 36mm trial head. Trial reduction was then performed and excellent stability was noted with at 90 of flexion with 70 of internal rotation and then full extension with maximal external rotation. The hip could not be dislocated in full extension. The knee could easily flex  to about 130 degrees. We also stretched the abductors at this point,  because of the preexisting adductor contractures. All trial components  were then removed. The acetabulum was irrigated out with normal saline  solution. A titanium [facility] was then screwed into place  followed by a 10-degree polyethylene liner index superior-posterior. On  the femoral side a 18Dlg ZTT1 cone was hammered into place, followed by a [id number] SROM stem in 25 degrees of anteversion. At this point, a +0 36 mm ceramic head was  hammered on the stem. The hip was reduced. We checked our stability  one more time and found to be excellent. The wound was once again  thoroughly irrigated out with normal saline solution pulse lavage. The  capsular flap and short external rotators were repaired back to the  intertrochanteric crest through drill holes with a #2 Ethibond suture.  The IT band was closed with running 1 Vicryl suture. The subcutaneous    tissue with 0 and 2-0 undyed Vicryl suture and the skin with running  interlocking 3-0 nylon suture. Dressing of Xeroform and Mepilex was  then applied. The patient was then unclamped, rolled supine, awaken extubated and taken to recovery room without difficulty in stable condition.   Manya Balash J 11/20/2010, 11:13 AM

## 2010-11-20 NOTE — Brief Op Note (Signed)
11/20/2010  2:36 PM  PATIENT:  Delma Post  77 y.o. male  PRE-OPERATIVE DIAGNOSIS:  DEGENERATIVE JOINT DISEASE, right knee  POST-OPERATIVE DIAGNOSIS:  DEGENERATIVE JOINT DISEASE, right knee  PROCEDURE:  Procedure(s): TOTAL KNEE ARTHROPLASTY  SURGEON:  Surgeon(s): Nestor Lewandowsky  PHYSICIAN ASSISTANT: Mauricia Area PA-C  ANESTHESIA:   regional and general  EBL:  Total I/O In: 1800 [I.V.:1800] Out: 125 [Urine:125]  BLOOD ADMINISTERED:none  DRAINS: (2) Hemovact drain(s) in the R Knee with  Suction Open and Urinary Catheter (Foley)   LOCAL MEDICATIONS USED:  NONE  SPECIMEN:  No Specimen  DISPOSITION OF SPECIMEN:  N/A  COUNTS:  YES  TOURNIQUET:  * Missing tourniquet times found for documented tourniquets in log:  6999 *  DICTATION: .Note written in EPIC  PLAN OF CARE: Admit to inpatient   PATIENT DISPOSITION:  PACU - hemodynamically stable.   Delay start of Pharmacological VTE agent (>24hrs) due to surgical blood loss or risk of bleeding:  {YES/NO/NOT APPLICABLE:20182

## 2010-11-20 NOTE — Progress Notes (Signed)
ANTICOAGULATION CONSULT NOTE - Follow Up Consult  Pharmacy Consult for  Coumadin Indication: VTE prophylaxis s/p right TKA  Allergies  Allergen Reactions  . Contrast Media (Iodinated Diagnostic Agents) Hives    Patient Measurements:   Height:  69 inches Weight:  71.9 kg   Pre-op labs 11/2:      PT 13.7 sec/ INR 1.03      H/H  12.0/35.5   PLTC  250K      Scr 2.07,   No LFTs      Medications:      Has been off Aspirin for about 5 days.     Other meds continued: Amlodipine 5 mg daily, Lipitor (sub Zocor here), Oscal D BID, Imdur 120 mg daily, Metoprolol 50 mg daily, MVI, Ranexa 500 mg BID  Assessment:    To begin Coumadin for post-pt VTE prophylaxis  Goal of Therapy:    INR 1.5-2   Plan:    Will begin Coumadin with 7.5 mg tonight.   Daily PT/INR to begin in AM.   Will provide Coumadin education materials.   Will schedule Lovenox 30 mg SQ q12h to begin at [facility] 11/13.  To continue til INR >1.5  Dennie Fetters, RPh 11/20/2010,8:24 PM

## 2010-11-20 NOTE — Transfer of Care (Signed)
Immediate Anesthesia Transfer of Care Note  Patient: [name]  Procedure(s) Performed:  TOTAL KNEE ARTHROPLASTY - RIGHT ARTHROPLASTY KNEE TOTAL  Patient Location: PACU  Anesthesia Type: Spinal  Level of Consciousness: awake  Airway & Oxygen Therapy: Patient connected to face mask oxygen  Post-op Assessment: Report given to PACU RN, Post -op Vital signs reviewed and stable and Patient moving all extremities  Post vital signs: stable  Complications: No apparent anesthesia complications

## 2010-11-20 NOTE — Progress Notes (Signed)
Pt's wife and other family members present at bedside

## 2010-11-20 NOTE — Progress Notes (Signed)
CPM applied to RLE bending at 30 degrees

## 2010-11-20 NOTE — Anesthesia Postprocedure Evaluation (Signed)
  Anesthesia Post-op Note  Patient: [name]  Procedure(s) Performed:  TOTAL KNEE ARTHROPLASTY - RIGHT ARTHROPLASTY KNEE TOTAL  Patient Location: PACU  Anesthesia Type: Spinal  Level of Consciousness: alert   Airway and Oxygen Therapy: Patient Spontanous Breathing  Post-op Pain: none  Post-op Assessment: Post-op Vital signs reviewed  Post-op Vital Signs: stable  Complications: No apparent anesthesia complications

## 2010-11-20 NOTE — Interval H&P Note (Signed)
History and Physical Interval Note:   11/20/2010   12:57 PM   [name]  has presented today for surgery, with the diagnosis of DEGENERATIVE JOINT DISEASE  The various methods of treatment have been discussed with the patient and family. After consideration of risks, benefits and other options for treatment, the patient has consented to  Procedure(s): TOTAL KNEE ARTHROPLASTY as a surgical intervention .  The patients' history has been reviewed, patient examined, no change in status, stable for surgery.  I have reviewed the patients' chart and labs.  Questions were answered to the patient's satisfaction.     Nestor Lewandowsky  MD

## 2010-11-20 NOTE — Anesthesia Procedure Notes (Addendum)
Anesthesia Regional Block:  Femoral nerve block  Pre-Anesthetic Checklist: ,, timeout performed, Correct Patient, Correct Site, Correct Laterality, Correct Procedure, Correct Position, site marked, Risks and benefits discussed, at surgeon's request and post-op pain management   Prep: Betadine       Needles:  Injection technique: Single-shot  Needle Type: Stimulator Needle - 40      Needle Gauge: 22 and 22 G  Needle insertion depth: 2 cm   Additional Needles:  Procedures: nerve stimulator Femoral nerve block  Nerve Stimulator or Paresthesia:  Response: Twitch elicited, 0.8 mA,   Additional Responses:   Narrative:  Start time: 11/20/2010 12:20 PM End time: 11/20/2010 12:35 PM Injection made incrementally with aspirations every 5 mL.  Performed by: Personally   Additional Notes: BP cuff, EKG monitors applied. Sedation begun. Femoral artery palpated for location of nerve. After nerve location anesthetic injected incrementally, slowly , and after neg aspirations. Tolerated well.  Femoral nerve block Spinal  Patient location during procedure: OR Start time: 11/20/2010 1:05 PM End time: 11/20/2010 1:16 PM Staffing Anesthesiologist: Ester Rink TERRILL Performed by: anesthesiologist  Preanesthetic Checklist Completed: patient identified, timeout performed, IV checked and risks and benefits discussed Spinal Block Patient position: sitting Prep: Betadine Patient monitoring: blood pressure and cardiac monitor Approach: midline Location: L3-4 Injection technique: single-shot Needle Needle type: Quincke  Needle gauge: 25 G Needle length: 5 cm Needle insertion depth: 3 cm Assessment Sensory level: T6 Additional Notes Tolerated well.  Procedure Name: MAC Date/Time: 11/20/2010 1:05 PM Performed by: Elon Alas Pre-anesthesia Checklist: Patient identified, Timeout performed, Emergency Drugs available, Suction available and Patient being  monitored Patient Re-evaluated:Patient Re-evaluated prior to inductionOxygen Delivery Method: Simple face mask Placement Confirmation: positive ETCO2

## 2010-11-20 NOTE — Op Note (Signed)
PATIENT ID:      AMIERE [name]  MRN:     [id number] DOB/AGE:    06/08/1932 / 77 y.o.       OPERATIVE REPORT    DATE OF PROCEDURE:  11/20/2010       PREOPERATIVE DIAGNOSIS:   DEGENERATIVE JOINT DISEASE, right knee      There is no height or weight on file to calculate BMI.                                                        POSTOPERATIVE DIAGNOSIS:   DEGENERATIVE JOINT DISEASE, right knee                                                                      PROCEDURE:  Procedure(s): TOTAL KNEE ARTHROPLASTY Using Depuy Sigma RP implants #4 Femur, #5Tibia, 10mm sigma RP bearing, 38 Patella     SURGEON: Sayf Kerner J    ASSISTANT:   Shirl Harris PA-C   (Present and scrubbed throughout the case, critical for assistance with exposure, retraction, instrumentation, and closure.)         ANESTHESIA: General and Regional   DRAINS: (2) Hemovact drain(s) in the r knee with  Suction Open and Urinary Catheter (Foley)   TOURNIQUET TIME: * Missing tourniquet times found for documented tourniquets in log:  6999 *    COMPLICATIONS:  None     SPECIMENS: None   INDICATIONS FOR PROCEDURE: The patient has  DEGENERATIVE JOINT DISEASE, right knee, varus deformities, XR shows bone on bone arthritis. Patient has failed all conservative measures including anti-inflammatory medicines, narcotics, attempts at  exercise and weight loss, cortisone injections and viscosupplementation.  Risks and benefits of surgery have been discussed, questions answered.   DESCRIPTION OF PROCEDURE: The patient identified by armband, received  right femoral nerve block and IV antibiotics, in the holding area at [facility]. Patient taken to the operating room, appropriate anesthetic  monitors were attached General endotracheal anesthesia induced with  the patient in supine position, Foley catheter was inserted. Tourniquet  applied high to the operative thigh. Lateral post and foot positioner  applied to the table,  the lower extremity was then prepped and draped  in usual sterile fashion from the ankle to the tourniquet. Time-out procedure was performed. The limb was wrapped with an Esmarch bandage and the tourniquet inflated to 350 mmHg. We began the operation by making the anterior midline incision starting at  handbreadth above the patella going over the patella 1 cm medial to and  4 cm distal to the tibial tubercle. Small bleeders in the skin and the  subcutaneous tissue identified and cauterized. Transverse retinaculum was incised and reflected medially and a medial parapatellar arthrotomy was accomplished. the patella was everted and theprepatellar fat pad resected. The superficial medial collateral  ligament was then elevated from anterior to posterior along the proximal  flare of the tibia and anterior half of the menisci resected. The knee was hyperflexed exposing bone on bone arthritis. Peripheral and notch osteophytes as well as the cruciate ligaments were  then resected. We continued to  work our way around posteriorly along the proximal tibia, and externally  rotated the tibia subluxing it out from underneath the femur. A McHale  retractor was placed through the notch and a lateral Hohmann retractor  placed, and we then drilled through the proximal tibia in line with the  axis of the tibia followed by an intramedullary guide rod and 2-degree  posterior slope cutting guide. The tibial cutting guide was pinned into place  allowing resection of 4 mm of bone medially and about 9 mm of bone  laterally because of her varus deformity. Satisfied with the tibial resection, we then  entered the distal femur 2 mm anterior to the PCL origin with the  intramedullary guide rod and applied the distal femoral cutting guide  set at 11mm, with 5 degrees of valgus. This was pinned along the  epicondylar axis. At this point, the distal femoral cut was accomplished without difficulty. We then sized for a #4R femoral  component and pinned the guide in 3 degrees of external rotation.The chamfer cutting guide was pinned into place. The anterior, posterior, and chamfer cuts were accomplished without difficulty followed by  the Sigma RP box cutting guide and the box cut. We also removed posterior osteophytes from the posterior femoral condyles. At this  time, the knee was brought into full extension. We checked our  extension and flexion gaps and found them symmetric at 10mm.  The patella thickness measured at 26 mm. We felt a 38mm patella would  fit. We set the cutting guide at 15 and removed the posterior 9.5-10 mm  of the patella sized for 38mm button and drilled the lollipop. The knee  was then once again hyperflexed exposing the proximal tibia. We sized for a #5 tibial base plate, applied the smokestack and the conical reamer followed by the the Delta fin keel punch. We then hammered into place the Sigma RP trial femoral component, inserted a 10-mm trial bearing, trial patellar button, and took the knee through range of motion from 0-130 degrees. No thumb pressure was required for patellar  tracking. At this point, all trial components were removed, a double batch of DePuy HV cement with 1500 mg of Zinacef was mixed and applied to all bony metallic mating surfaces except for the posterior condyles of the femur itself. In order, we  hammered into place the tibial tray and removed excess cement, the femoral component and removed excess cement, a 10-mm Sigma RP bearing  was inserted, and the knee brought to full extension with compression.  The patellar button was clamped into place, and excess cement  removed. While the cement cured the wound was irrigated out with normal saline solution pulse lavage, and medium Hemovac drains were placed from an anterolateral  approach. Ligament stability and patellar tracking were checked and found to be excellent. The parapatellar arthrotomy was closed with  running #1 Vicryl  suture. The subcutaneous tissue with 0 and 2-0 undyed  Vicryl suture, and the skin with skin staples. A dressing of Xeroform,  4 x 4, dressing sponges, Webril, and Ace wrap applied. The patient  awakened, extubated, and taken to recovery room without difficulty.   Nestor Lewandowsky 11/20/2010, 2:39 PM

## 2010-11-21 LAB — BASIC METABOLIC PANEL
Creatinine, Ser: 1.47 mg/dL — ABNORMAL HIGH (ref 0.50–1.35)
GFR calc Af Amer: 52 mL/min — ABNORMAL LOW (ref >90)
Glucose, Bld: 146 mg/dL — ABNORMAL HIGH (ref 70–99)
Sodium: 135 mEq/L (ref 135–145)

## 2010-11-21 LAB — CBC
Hemoglobin: 9.8 g/dL — ABNORMAL LOW (ref 13.0–17.0)
MCH: 28.7 pg (ref 26.0–34.0)
MCHC: 32.8 g/dL (ref 30.0–36.0)

## 2010-11-21 LAB — PROTIME-INR: Prothrombin Time: 13.9 seconds (ref 11.6–15.2)

## 2010-11-21 MED ORDER — WARFARIN SODIUM 5 MG PO TABS
5.0000 mg | ORAL_TABLET | Freq: Once | ORAL | Status: AC
  Administered 2010-11-21: 5 mg via ORAL
  Filled 2010-11-21: qty 1

## 2010-11-21 MED ORDER — WARFARIN SODIUM 7.5 MG PO TABS
7.5000 mg | ORAL_TABLET | Freq: Once | ORAL | Status: DC
  Filled 2010-11-21: qty 1

## 2010-11-21 NOTE — Addendum Note (Signed)
Addendum  created 11/21/10 1321 by Elon Alas   Modules edited:Anesthesia Medication Administration

## 2010-11-21 NOTE — Progress Notes (Addendum)
ANTICOAGULATION CONSULT NOTE - Follow Up Consult  Pharmacy Consult for coumadin with enoxaparin bridge Indication: VTE prophylaxis  Allergies  Allergen Reactions  . Contrast Media (Iodinated Diagnostic Agents) Hives    Patient Measurements: Height: 5\' 9"  (175.3 cm) Weight: 158 lb 8 oz (71.895 kg) IBW/kg (Calculated) : 70.7    Vital Signs: Temp: 98 F (36.7 C) (11/13 1406) Temp src: Oral (11/13 1406) BP: 128/51 mmHg (11/13 1406) Pulse Rate: 66  (11/13 1406)  Labs:  [facility] 11/21/10 0615  HGB 9.8*  HCT 29.9*  PLT 231  APTT --  LABPROT 13.9  INR 1.05  HEPARINUNFRC --  CREATININE 1.47*  CKTOTAL --  CKMB --  TROPONINI --   Estimated Creatinine Clearance: 42.8 ml/min (by C-G formula based on Cr of 1.47).   Medications:  Scheduled:    . amLODipine  5 mg Oral Daily  . calcium-vitamin D  1 tablet Oral BID  . ceFAZolin (ANCEF) IV  1 g Intravenous Once  . docusate sodium  100 mg Oral BID  . enoxaparin  30 mg Subcutaneous Q12H  . fentaNYL      . fentaNYL      . HYDROmorphone      . isosorbide mononitrate  120 mg Oral Daily  . methocarbamol(ROBAXIN) IV  500 mg Intravenous Once  . metoprolol  50 mg Oral Daily  . multivitamins ther. w/minerals  1 tablet Oral Daily  . patient's guide to using coumadin book   Does not apply Once  . ranolazine  500 mg Oral BID  . simvastatin  40 mg Oral Daily  . warfarin  7.5 mg Oral Once  . warfarin   Does not apply Once  . DISCONTD: multivitamin  1 tablet Oral Daily    Assessment: Patient on 7.5mg  warfarin and enoxaparin 30mg  q12.   Dosage of enoxaparin is fine but if renal function falls, will need to change to 40mg  daily.  Renal creatinine is highlighted in red.  Will watch.  Goal of Therapy:  INR goal is 1.5 to 2  Plan:  Lovenox  Bridge to continue with an overlap of 5 days.  Warfarin 7.5mg  today at 1800. Will watch renal function and INR.  Walden Field B 11/21/2010,2:59 PM

## 2010-11-21 NOTE — Progress Notes (Signed)
Physical Therapy Treatment Patient Details Name: [name] MRN: [id number] DOB: 06-10-1932 Today's Date: 11/21/2010  PT Assessment/Plan  PT - Assessment/Plan Comments on Treatment Session: Pt progressing well. Pt still limited by pain and fear to bear more weight through RLE. Minimal quad weakness. Will attempt increased ambulation distance next session PT Plan: Discharge plan remains appropriate PT Frequency: 7X/week Follow Up Recommendations: Home health PT Equipment Recommended: Rolling walker with 5" wheels;3 in 1 bedside comode PT Goals  Acute Rehab PT Goals PT Goal Formulation: With patient Time For Goal Achievement: 7 days Pt will go Sit to Supine/Side: with modified independence PT Goal: Sit to Supine/Side - Progress: Progressing toward goal Pt will Transfer Sit to Stand/Stand to Sit: with modified independence PT Transfer Goal: Sit to Stand/Stand to Sit - Progress: Progressing toward goal Pt will Ambulate: >150 feet;with modified independence;with rolling walker PT Goal: Ambulate - Progress: Progressing toward goal Pt will Perform Home Exercise Program: with supervision, verbal cues required/provided PT Goal: Perform Home Exercise Program - Progress: Progressing toward goal  PT Treatment Precautions/Restrictions  Precautions Precautions: Knee Restrictions Weight Bearing Restrictions: Yes RLE Weight Bearing: Weight bearing as tolerated Mobility (including Balance) Bed Mobility Bed Mobility: No Supine to Sit: 4: Min assist;HOB flat;With rails Supine to Sit Details (indicate cue type and reason): cues to initate and pre-position for greater ease Sitting - Scoot to Edge of Bed: 4: Min assist Sitting - Scoot to [place] of Bed Details (indicate cue type and reason): great UE support Transfers Transfers: Yes Sit to Stand: 4: Min assist;From chair/3-in-1 Sit to Stand Details (indicate cue type and reason): VC for hand placement and safety to RW Stand to Sit: 4: Min  assist Stand to Sit Details: Controlled descent for most of the descent, uncontrolled at the end. VC for hand placement Ambulation/Gait Ambulation/Gait: Yes Ambulation/Gait Assistance: 4: Min assist (Minguard assist) Ambulation/Gait Assistance Details (indicate cue type and reason): VC for even step length as well as postural cues Ambulation Distance (Feet): 50 Feet Assistive device: Rolling walker Gait Pattern: Step-to pattern;Decreased step length - left;Decreased stance time - right;Decreased hip/knee flexion - right;Trunk flexed Gait velocity: Decreased gait speed. Gait speed varied throughout ambulation distance Stairs: No  Posture/Postural Control Posture/Postural Control: No significant limitations Balance Balance Assessed: No Exercise  Total Joint Exercises Quad Sets: AROM;10 reps Heel Slides: AAROM;10 reps Hip ABduction/ADduction: AROM;Right;10 reps;Strengthening;Supine Straight Leg Raises: AAROM;Right;Strengthening;10 reps;Supine Long Arc Quad: AAROM;Strengthening;Right;10 reps;Seated End of Session PT - End of Session Equipment Utilized During Treatment: Gait belt Activity Tolerance: Patient tolerated treatment well Patient left: in chair;with call bell in reach;with family/visitor present Nurse Communication: Mobility status for transfers;Mobility status for ambulation General Behavior During Session: [facility] for tasks performed Cognition: [name] for tasks performed  [name] 11/21/2010, 3:38 PM  11/21/2010 [name] DPT PAGER: [phone] OFFICE: [phone]

## 2010-11-21 NOTE — Progress Notes (Signed)
Clinical Social Work-CSW reviewed chart; per MD d/c plan is home with home health. At this time no CSW needs identified however CSW will follow up after pt/ot evaluations and is available for d/c planning PRN. Bonnye Fava, MSW, [phone]

## 2010-11-21 NOTE — Progress Notes (Signed)
Physical Therapy Evaluation Patient Details Name: [name] MRN: [id number] DOB: 1932/02/05 Today's Date: 11/21/2010  Problem List:  Patient Active Problem List  Diagnoses  . Osteoarthritis of right knee    Past Medical History:  Past Medical History  Diagnosis Date  . Coronary artery disease   . Angina   . Hypertension   . Heart murmur   . Myocardial infarction     PT SEES DR SMITH REQ STRESS, ECHO  . Peripheral vascular disease     PT HAS 2 STENTS  . Diabetes mellitus     BORDERLINE  NO CURRENT MEDS  . Cancer     SKIN CANCERS REMOVED   . Arthritis     DJD   Past Surgical History:  Past Surgical History  Procedure Date  . Joint replacement     RIGHT KNEE   . Coronary artery bypass graft     79/84/93 CABG 95 CHEST HERNIA  . Cardiac catheterization   . Wrist surgery 2004    LEFT WRIST   . Hernia repair     CHEST AND HIATAL HERNIA SURGERY  . Cataract extraction, bilateral     PT Assessment/Plan/Recommendation PT Assessment Clinical Impression Statement: 77 yo male s/p R TKA presents with functional dependencies secondary to pain, decr rom and strength, decr activity tol; will benefit from acute PT to maximize I and safety with mobility/amb/transfers/therex to enable safe dc home PT Recommendation/Assessment: Patient will need skilled PT in the acute care venue PT Problem List: Decreased strength;Decreased range of motion;Decreased activity tolerance;Decreased mobility;Decreased knowledge of use of DME;Decreased knowledge of precautions;Pain Barriers to Discharge:  (not sure how much physicalA wife can give) PT Therapy Diagnosis : Difficulty walking;Abnormality of gait;Acute pain PT Plan PT Frequency: 7X/week PT Treatment/Interventions: DME instruction;Gait training;Stair training;Functional mobility training;Therapeutic exercise;Patient/family education PT Recommendation Recommendations for Other Services: OT consult Follow Up Recommendations: Home health  PT Equipment Recommended: Rolling walker with 5" wheels;3 in 1 bedside comode PT Goals  Acute Rehab PT Goals PT Goal Formulation: With patient Time For Goal Achievement: 7 days Pt will go Sit to Supine/Side: with modified independence PT Goal: Sit to Supine/Side - Progress: Progressing toward goal Pt will Transfer Sit to Stand/Stand to Sit: with modified independence PT Transfer Goal: Sit to Stand/Stand to Sit - Progress: Progressing toward goal Pt will Ambulate: >150 feet;with modified independence;with rolling walker PT Goal: Ambulate - Progress: Progressing toward goal Pt will Perform Home Exercise Program: with supervision, verbal cues required/provided PT Goal: Perform Home Exercise Program - Progress: Other (comment)  PT Evaluation Precautions/Restrictions  Precautions Precautions: Knee Restrictions Weight Bearing Restrictions: Yes RLE Weight Bearing: Weight bearing as tolerated Prior Functioning  Home Living Lives With: Spouse Receives Help From: Family Type of Home: House Home Layout: Multi-level;Able to live on main level with bedroom/bathroom Home Access: Level entry Home Adaptive Equipment: None Additional Comments: patient's wife is curious about getting a 4wheeled RW; this is not necessarily indicated -- pt would need to have good control as the back wheels can fishtail Prior Function Level of Independence: Independent with basic ADLs;Independent with gait Cognition Cognition Arousal/Alertness: Awake/alert Overall Cognitive Status: Appears within functional limits for tasks assessed Orientation Level: Oriented to person;Oriented to place;Oriented to situation;Disoriented to time Sensation/Coordination Sensation Light Touch: Appears Intact Additional Comments: disoriented to time likely secondary to pain meds Coordination Gross Motor Movements are Fluid and Coordinated: Yes Fine Motor Movements are Fluid and Coordinated: Yes Extremity Assessment RUE  Assessment RUE Assessment: Within Functional Limits LUE  Assessment LUE Assessment: Within Functional Limits RLE Assessment RLE Assessment: Exceptions to [facility] RLE Strength RLE Overall Strength Comments: Decr ROM and strength, limited by postop pain; good quad activation; range approx 2-70 degrees LLE Assessment LLE Assessment: Within Functional Limits Mobility (including Balance) Bed Mobility Bed Mobility: Yes Supine to Sit: 4: Min assist;HOB flat;With rails Supine to Sit Details (indicate cue type and reason): cues to initate and pre-position for greater ease Sitting - Scoot to Edge of Bed: 4: Min assist Sitting - Scoot to [place] of Bed Details (indicate cue type and reason): great UE support Transfers Transfers: Yes Sit to Stand: 4: Min assist Sit to Stand Details (indicate cue type and reason): cues for optimal positioning Stand to Sit: 4: Min assist Stand to Sit Details: cues for hand placement, control, safety Ambulation/Gait Ambulation/Gait: Yes Ambulation/Gait Assistance: Other (comment);4: Min assist (second person for IV pole and to push chair behind) Ambulation/Gait Assistance Details (indicate cue type and reason): cues for sequence and to activate quad for R stance stability Ambulation Distance (Feet): 45 Feet Assistive device: Rolling walker Gait Pattern: Step-to pattern  Posture/Postural Control Posture/Postural Control: No significant limitations Balance Balance Assessed: No Exercise  Total Joint Exercises Quad Sets: AROM;10 reps Heel Slides: AAROM;10 reps End of Session PT - End of Session Equipment Utilized During Treatment: Gait belt Activity Tolerance: Patient tolerated treatment well Patient left: in chair;with call bell in reach;with family/visitor present General Behavior During Session: [facility] for tasks performed Cognition: [facility] for tasks performed  [place], [place] [phone]  11/21/2010, 1:49 PM

## 2010-11-21 NOTE — Progress Notes (Signed)
Occupational Therapy Evaluation Patient Details Name: [name] MRN: [id number] DOB: August 20, 1932 Today's Date: 11/21/2010  Problem List:  Patient Active Problem List  Diagnoses  . Osteoarthritis of right knee    Past Medical History:  Past Medical History  Diagnosis Date  . Coronary artery disease   . Angina   . Hypertension   . Heart murmur   . Myocardial infarction     PT SEES DR SMITH REQ STRESS, ECHO  . Peripheral vascular disease     PT HAS 2 STENTS  . Diabetes mellitus     BORDERLINE  NO CURRENT MEDS  . Cancer     SKIN CANCERS REMOVED   . Arthritis     DJD   Past Surgical History:  Past Surgical History  Procedure Date  . Joint replacement     RIGHT KNEE   . Coronary artery bypass graft     79/84/93 CABG 95 CHEST HERNIA  . Cardiac catheterization   . Wrist surgery 2004    LEFT WRIST   . Hernia repair     CHEST AND HIATAL HERNIA SURGERY  . Cataract extraction, bilateral     OT Assessment/Plan/Recommendation OT Assessment Clinical Impression Statement: Pt requires min A with lower body ADL tasks and ADL Mobility. Pt will have all necessary A at home at d/c. Recommend 3:1 and HHOT for safe D/C home. Pt would benefit from acute OT therapy to increase independence with lower body ADLs to Supervision for safe d/c home with HHOT.  OT Recommendation/Assessment: Patient will need skilled OT in the acute care venue OT Problem List: Decreased activity tolerance;Impaired balance (sitting and/or standing);Decreased safety awareness;Decreased knowledge of use of DME or AE;Decreased knowledge of precautions;Pain Problem List Comments: Pt experienced nausea with mobility throughout therapy session. Barriers to Discharge: None OT Therapy Diagnosis : Generalized weakness;Acute pain OT Plan OT Frequency: Min 2X/week OT Treatment/Interventions: Self-care/ADL training;Energy conservation;DME and/or AE instruction;Therapeutic activities;Patient/family education;Balance  training OT Recommendation Follow Up Recommendations: Home health OT Equipment Recommended: Rolling walker with 5" wheels;3 in 1 bedside comode Individuals Consulted Consulted and Agree with Results and Recommendations: Patient;Family member/caregiver Family Member Consulted: Wife OT Goals Acute Rehab OT Goals OT Goal Formulation: With patient Time For Goal Achievement: 2 weeks ADL Goals Pt Will Perform Grooming: Standing at sink;with supervision;Unsupported;Other (comment) (with RW) Pt Will Perform Lower Body Bathing: with supervision;Sitting in shower;Unsupported Pt Will Perform Lower Body Dressing: with supervision;Sit to stand from chair;Unsupported Pt Will Transfer to Toilet: with supervision;Stand pivot transfer;Raised toilet seat with arms;Maintaining weight bearing status;Other (comment) (3:1 over toilet) Pt Will Perform Toileting - Clothing Manipulation: with supervision;Standing Pt Will Perform Toileting - Hygiene: with supervision;Sit to stand from 3-in-1/toilet Pt Will Perform Tub/Shower Transfer: with supervision;Ambulation;Shower seat with back  OT Evaluation Precautions/Restrictions  Precautions Precautions: Knee Restrictions Weight Bearing Restrictions: Yes RLE Weight Bearing: Weight bearing as tolerated Prior Functioning Home Living Lives With: Spouse Receives Help From: Family Type of Home: House Home Layout: Multi-level;Able to live on main level with bedroom/bathroom Home Access: Level entry Bathroom Shower/Tub: Walk-in shower;Door Teacher, early years/pre: Yes How Accessible: Accessible via walker Home Adaptive Equipment: None Additional Comments: patient's wife is curious about getting a 4wheeled RW; this is not necessarily indicated -- pt would need to have good control as the back wheels can fishtail Prior Function Level of Independence: Independent with basic ADLs;Independent with gait Able to Take Stairs?: Yes Driving:  Yes ADL ADL Eating/Feeding: Simulated;Set up Where Assessed - Eating/Feeding: Chair Grooming: Performed;Wash/dry  face;Set up Where Assessed - Grooming: Sitting, chair Upper Body Bathing: Simulated;Set up Where Assessed - Upper Body Bathing: Sitting, chair Lower Body Bathing: Simulated;Minimal assistance Where Assessed - Lower Body Bathing: Sit to stand from chair Upper Body Dressing: Simulated;Set up Where Assessed - Upper Body Dressing: Sitting, chair Lower Body Dressing: Performed;Minimal assistance Where Assessed - Lower Body Dressing: Sit to stand from chair Toilet Transfer: Performed;Minimal assistance Toilet Transfer Method: Stand pivot Toilet Transfer Equipment: Raised toilet seat with arms (or 3-in-1 over toilet) Toileting - Clothing Manipulation: Simulated;Supervision/safety Where Assessed - Toileting Clothing Manipulation: Sit to stand from 3-in-1 or toilet Toileting - Hygiene: Simulated;Supervision/safety Where Assessed - Toileting Hygiene: Sit to stand from 3-in-1 or toilet Tub/Shower Transfer: Not assessed Tub/Shower Transfer Method: Not assessed Equipment Used: Rolling walker;Other (comment) (gait belt) ADL Comments: pt. wife can provide necessary A with ADL Tasks.  Vision/Perception  Vision - History Baseline Vision: No visual deficits Patient Visual Report: No change from baseline Vision - Assessment Eye Alignment: Within Functional Limits Vision Assessment: Vision not tested Cognition Cognition Arousal/Alertness: Awake/alert Overall Cognitive Status: Appears within functional limits for tasks assessed Orientation Level: Oriented X4 Sensation/Coordination Sensation Light Touch: Appears Intact Additional Comments: disoriented to time likely secondary to pain meds Coordination Gross Motor Movements are Fluid and Coordinated: Yes Fine Motor Movements are Fluid and Coordinated: Yes Extremity Assessment RUE Assessment RUE Assessment: Within Functional  Limits LUE Assessment LUE Assessment: Within Functional Limits Mobility  Bed Mobility Bed Mobility: No Supine to Sit: 4: Min assist;HOB flat;With rails Supine to Sit Details (indicate cue type and reason): cues to initate and pre-position for greater ease Sitting - Scoot to Edge of Bed: 4: Min assist Sitting - Scoot to [place] of Bed Details (indicate cue type and reason): great UE support Transfers Transfers: Yes Sit to Stand: 4: Min assist Sit to Stand Details (indicate cue type and reason): cues for optimal positioning Stand to Sit: 4: Min assist Stand to Sit Details: cues for hand placement, control, safety Exercises End of Session OT - End of Session Equipment Utilized During Treatment: Gait belt Activity Tolerance: Patient limited by fatigue;Patient limited by pain;Other (comment) (nausea) Patient left: in chair;with call bell in reach;with family/visitor present General Behavior During Session: [facility] for tasks performed Cognition: [facility] for tasks performed   Otis Peak OTS 11/21/2010, 3:10 PM   11/21/2010 Lucile Shutters   OTR/L Pager: [phone] Office: [phone] .

## 2010-11-21 NOTE — Progress Notes (Signed)
11/20/10  2130  Pt refusing to wear CPM machine. Pt states that he needs to lower his leg some to get pain relief. States he will use CPM again once pain is under better control. Pain medication administered. Will continue to monitor and re-attempt CPM.  Hively, Avie Echevaria , RN

## 2010-11-21 NOTE — Progress Notes (Signed)
ANTICOAGULATION CONSULT NOTE - Follow Up Consult  Pharmacy Consult for coumadin Indication: VTE prophylaxis  Allergies  Allergen Reactions  . Contrast Media (Iodinated Diagnostic Agents) Hives    Patient Measurements: Height: 5\' 9"  (175.3 cm) Weight: 158 lb 8 oz (71.895 kg) IBW/kg (Calculated) : 70.7    Vital Signs: Temp: 98 F (36.7 C) (11/13 1406) Temp src: Oral (11/13 1406) BP: 128/51 mmHg (11/13 1406) Pulse Rate: 66  (11/13 1406)  Labs:  [facility] 11/21/10 0615  HGB 9.8*  HCT 29.9*  PLT 231  APTT --  LABPROT 13.9  INR 1.05  HEPARINUNFRC --  CREATININE 1.47*  CKTOTAL --  CKMB --  TROPONINI --   Estimated Creatinine Clearance: 42.8 ml/min (by C-G formula based on Cr of 1.47).   Assessment: Addendum to pharmacy note today.  Goal of Therapy:  INR 1.5-2   Plan:  Changed today's Coumadin dose to 5mg  today instead of 7.5mg  since INR goal is 1.5-2 per MD's order.  Arman Filter 11/21/2010,3:38 PM

## 2010-11-21 NOTE — Progress Notes (Signed)
  PATIENT ID: [name]  MRN: [id number]  DOB/AGE:  1932-02-12 / 77 y.o.  1 Day Post-Op Procedure(s) (LRB): TOTAL KNEE ARTHROPLASTY (Right)    PROGRESS NOTE Subjective: Patient is alert, oriented, no Nausea, no Vomiting, yes passing gas, no Bowel Movement. Taking PO liquids. Denies SOB, Chest or Calf Pain. Using Incentive Spirometer, PAS in place. Ambulate today, CPM 0-30 Patient reports pain as 2 on 0-10 scale  .    Objective: Vital signs in last 24 hours: Filed Vitals:   11/20/10 2205 11/21/10 0202 11/21/10 0547 11/21/10 0605  BP: 183/77 131/65 100/58 92/53  Pulse: 83 64  58  Temp: 97.7 F (36.5 C) 97.6 F (36.4 C)  98 F (36.7 C)  TempSrc:      Resp: 20 20  20   Height:      Weight:      SpO2: 93% 94%  95%      Intake/Output from previous day: I/O last 3 completed shifts: In: 4435 [I.V.:2360; Other:2075] Out: 1200 [Urine:575; Drains:475; Blood:150]   Intake/Output this shift:     LABORATORY DATA:  Basename 11/21/10 0615  WBC 7.8  HGB 9.8*  HCT 29.9*  PLT 231  NA --  K --  CL --  CO2 --  BUN --  CREATININE --  GLUCOSE --  INR --  CALCIUM --    Examination: Neurologically intact ABD soft Neurovascular intact Sensation intact distally Intact pulses distally Dorsiflexion/Plantar flexion intact Incision: no drainage No cellulitis present Compartment soft}  Assessment:   1 Day Post-Op Procedure(s) (LRB): TOTAL KNEE ARTHROPLASTY (Right) ADDITIONAL DIAGNOSIS:  none  Plan: PT/OT WBAT, CPM 5/hrs day until ROM 0-90 degrees, then D/C CPM DVT Prophylaxis:  Lovenox\Coumadin bridge target INR 1.5-2.5 DISCHARGE PLAN: Home DISCHARGE NEEDS: HHPT, HHRN, CPM, Walker and 3-in-1 comode seat     [name] J 11/21/2010, 7:05 AM

## 2010-11-22 LAB — CBC
Hemoglobin: 9.8 g/dL — ABNORMAL LOW (ref 13.0–17.0)
MCH: 29.1 pg (ref 26.0–34.0)
Platelets: 253 10*3/uL (ref 150–400)
RBC: 3.37 MIL/uL — ABNORMAL LOW (ref 4.22–5.81)
WBC: 9.8 10*3/uL (ref 4.0–10.5)

## 2010-11-22 LAB — PROTIME-INR
INR: 1.95 — ABNORMAL HIGH (ref 0.00–1.49)
Prothrombin Time: 22.6 seconds — ABNORMAL HIGH (ref 11.6–15.2)

## 2010-11-22 MED ORDER — ROSUVASTATIN CALCIUM 20 MG PO TABS
20.0000 mg | ORAL_TABLET | Freq: Every day | ORAL | Status: DC
  Filled 2010-11-22: qty 1

## 2010-11-22 MED ORDER — WARFARIN SODIUM 1 MG PO TABS
1.0000 mg | ORAL_TABLET | Freq: Once | ORAL | Status: AC
  Administered 2010-11-22: 1 mg via ORAL
  Filled 2010-11-22: qty 1

## 2010-11-22 NOTE — Progress Notes (Signed)
PATIENT ID: [name]  MRN: [id number]  DOB/AGE:  11-Feb-1932 / 78 y.o.  2 Days Post-Op Procedure(s) (LRB): TOTAL KNEE ARTHROPLASTY (Right)    PROGRESS NOTE Subjective: Patient is alert, oriented, no Nausea, no Vomiting, no passing gas, yes Bowel Movement. Taking PO well. Denies SOB, Chest or Calf Pain. Using Incentive Spirometer, PAS in place. Ambulating well. Patient reports pain as 5 on 0-10 scale  .    Objective: Vital signs in last 24 hours: Filed Vitals:   11/21/10 0605 11/21/10 1406 11/21/10 2230 11/22/10 0606  BP: 92/53 128/51 148/64 171/73  Pulse: 58 66 73 79  Temp: 98 F (36.7 C) 98 F (36.7 C) 98.6 F (37 C) 99.5 F (37.5 C)  TempSrc:  Oral    Resp: 20 18 19 20   Height:      Weight:      SpO2: 95% 93% 92% 96%      Intake/Output from previous day: I/O last 3 completed shifts: In: 800 [P.O.:240; I.V.:560] Out: 775 [Urine:600; Drains:175]   Intake/Output this shift:     LABORATORY DATA:  Basename 11/22/10 0732 11/21/10 0615  WBC 9.8 7.8  HGB 9.8* 9.8*  HCT 28.9* 29.9*  PLT 253 231  NA -- 135  K -- 4.1  CL -- 100  CO2 -- 25  BUN -- 20  CREATININE -- 1.47*  GLUCOSE -- 146*  INR 1.95* 1.05  CALCIUM -- 9.0    Examination: Neurologically intact ABD soft Neurovascular intact Sensation intact distally Incision: dressing C/D/I}  Assessment:   2 Days Post-Op Procedure(s) (LRB): TOTAL KNEE ARTHROPLASTY (Right) ADDITIONAL DIAGNOSIS:  none  Plan: PT/OT WBAT, CPM 5/hrs day until ROM 0-90 degrees, then D/C CPM DVT Prophylaxis:  Lovenox\Coumadin bridge target INR 1.5-2.5 DISCHARGE PLAN: Home DISCHARGE NEEDS: HHPT, HHRN, CPM, Walker and 3-in-1 comode seat     Sean Downs M. 11/22/2010, 8:28 AM

## 2010-11-22 NOTE — Progress Notes (Addendum)
Physical Therapy Treatment Patient Details Name: [name] MRN: [id number] DOB: 07-02-1932 Today's Date: 11/22/2010  PT Assessment/Plan  PT - Assessment/Plan Comments on Treatment Session: Good progress with amb distance Worth considering an OT eval for ADLS PT Plan: Discharge plan remains appropriate PT Frequency: 7X/week Follow Up Recommendations: Home health PT Equipment Recommended: Rolling walker with 5" wheels;3 in 1 bedside comode PT Goals  Acute Rehab PT Goals PT Goal Formulation: With patient PT Goal: Sit to Supine/Side - Progress: Progressing toward goal PT Transfer Goal: Sit to Stand/Stand to Sit - Progress: Progressing toward goal PT Goal: Ambulate - Progress: Progressing toward goal PT Goal: Perform Home Exercise Program - Progress: Progressing toward goal  PT Treatment Precautions/Restrictions  Precautions Precautions: Knee Restrictions Weight Bearing Restrictions: Yes RLE Weight Bearing: Weight bearing as tolerated Mobility (including Balance) Bed Mobility Supine to Sit: 4: Min assist;HOB elevated (Comment degrees) (from R side of bed to approx home) Supine to Sit Details (indicate cue type and reason): cues for safe technique Sitting - Scoot to Edge of Bed: 4: Min assist;Other (comment) (without physical contact) Sitting - Scoot to Edge of Bed Details (indicate cue type and reason): cues for safe technique; good boost with UEs Transfers Sit to Stand: 4: Min assist;From chair/3-in-1;Other (comment) (without physical contact) Sit to Stand Details (indicate cue type and reason): cues for safe hand placement Stand to Sit: 4: Min assist;To chair/3-in-1 (without physical contact) Stand to Sit Details: cues to control descent Ambulation/Gait Ambulation/Gait Assistance: 4: Min assist Ambulation/Gait Assistance Details (indicate cue type and reason): cues for sequence, optimal step length, activate R quad for stance stability Ambulation Distance (Feet): 80  Feet Assistive device: Rolling walker    Exercise  Total Joint Exercises Ankle Circles/Pumps: Other (comment) (NT) Quad Sets: AROM;Right;10 reps;Supine Gluteal Sets:  (NT) Towel Squeeze:  (NT) Short Arc Quad: AROM;Right;10 reps;Supine Heel Slides: AAROM;Right;10 reps;Supine Hip ABduction/ADduction:  (NT) Straight Leg Raises: AROM;Right;10 reps;Supine Long Arc Quad:  (NT) Knee Flexion:  (NT) Marching in Standing:  (NT) Standing Hip Extension:  (NT) Bridges:  (NT) End of Session PT - End of Session Equipment Utilized During Treatment: Gait belt Activity Tolerance: Patient tolerated treatment well Patient left: in chair;with call bell in reach;with family/visitor present Nurse Communication: Mobility status for transfers;Mobility status for ambulation General Behavior During Session: [facility] for tasks performed Cognition: [facility] for tasks performed (occasional need for repeating cues ?[address] [place], [place] [phone]  11/22/2010, 3:17 PM

## 2010-11-22 NOTE — Progress Notes (Signed)
Physical Therapy Treatment Patient Details Name: [name] MRN: [id number] DOB: 20-Oct-1932 Today's Date: 11/22/2010  PT Assessment/Plan  PT - Assessment/Plan Comments on Treatment Session: moving slower this pm, likely due to pain; discussed dc plan and progress with OT -- dc plan remains appropriate, highly recommend HHPT and HHOT to aid in transition home PT Plan: Discharge plan remains appropriate PT Frequency: 7X/week Follow Up Recommendations: Home health PT Equipment Recommended: Rolling walker with 5" wheels;3 in 1 bedside comode PT Goals  Acute Rehab PT Goals PT Goal Formulation: With patient PT Goal: Sit to Supine/Side - Progress: Progressing toward goal PT Transfer Goal: Sit to Stand/Stand to Sit - Progress: Progressing toward goal PT Goal: Ambulate - Progress: Progressing toward goal PT Goal: Perform Home Exercise Program - Progress: Progressing toward goal  PT Treatment Precautions/Restrictions  Precautions Precautions: Knee Restrictions Weight Bearing Restrictions: Yes RLE Weight Bearing: Weight bearing as tolerated Other Position/Activity Restrictions: self monitor for activity tolerance Mobility (including Balance) Bed Mobility Supine to Sit: 4: Min assist;HOB flat (without physical contact) Supine to Sit Details (indicate cue type and reason): cues for safe technique Sitting - Scoot to Edge of Bed: 5: Supervision;Other (comment) (cues for safe technique) Sitting - Scoot to Edge of Bed Details (indicate cue type and reason): cues for safe technique; good boost with UEs Transfers Sit to Stand: 4: Min assist;From chair/3-in-1 (without physical contact) Sit to Stand Details (indicate cue type and reason): cues for more comfortable positioning Stand to Sit: 4: Min assist;To bed (without physical contact) Stand to Sit Details: cues to keep RW close, square RW with edge of bed, touch bed with backs of LEs as pt was somewhat impulsivley sitting  down Ambulation/Gait Ambulation/Gait Assistance: 4: Min assist (with and without physical contact) Ambulation/Gait Assistance Details (indicate cue type and reason): visual and verbal cues for optimal step length and width; tending to step too far into RW Ambulation Distance (Feet): 80 Feet Assistive device: Rolling walker Gait Pattern:  (wide step width)    Exercise  Total Joint Exercises Ankle Circles/Pumps: Other (comment) (NT) Quad Sets: Other (comment) (NT) Gluteal Sets: Other (comment) (NT) Towel Squeeze: Other (comment) (NT) Short Arc Quad: Other (comment) (NT) Heel Slides: Other (comment) (NT) Hip ABduction/ADduction: Other (comment) (NT) Straight Leg Raises: Other (comment) (NT) Long Arc Quad: AAROM;Strengthening;Right;10 reps;Seated Knee Flexion: Right;AAROM;10 reps;Seated Marching in Standing:  (NT) Standing Hip Extension:  (NT) Bridges:  (NT) End of Session PT - End of Session Equipment Utilized During Treatment: Gait belt Activity Tolerance: Patient limited by pain Patient left: in bed;with call bell in reach;with family/visitor present Nurse Communication: Mobility status for transfers;Mobility status for ambulation General Behavior During Session: [facility] for tasks performed Cognition: [facility] for tasks performed (for simple mobility; pt seemed easily distractable this sess)  Van Clines, PT [phone]  11/22/2010, 5:05 PM

## 2010-11-22 NOTE — Progress Notes (Signed)
Clinical Social Work-Per pt eval pt appropriate for d/c home with home health-no CSW needs. Sign off-Jeremie Abdelaziz Mordecai Maes MSW, [phone]

## 2010-11-22 NOTE — Progress Notes (Signed)
ANTICOAGULATION CONSULT NOTE - Follow Up Consult  Pharmacy Consult for Coumadin Indication: VTE prophylaxis s/p Rt. TKA  Allergies  Allergen Reactions  . Contrast Media (Iodinated Diagnostic Agents) Hives    Patient Measurements: Height: 5\' 9"  (175.3 cm) Weight: 158 lb 8 oz (71.895 kg) IBW/kg (Calculated) : 70.7    Vital Signs: Temp: 99.5 F (37.5 C) (11/14 0606) BP: 171/73 mmHg (11/14 0606) Pulse Rate: 79  (11/14 0606)  Labs:  Basename 11/22/10 0732 11/21/10 0615  HGB 9.8* 9.8*  HCT 28.9* 29.9*  PLT 253 231  APTT -- --  LABPROT 22.6* 13.9  INR 1.95* 1.05  HEPARINUNFRC -- --  CREATININE -- 1.47*  CKTOTAL -- --  CKMB -- --  TROPONINI -- --   Estimated Creatinine Clearance: 42.8 ml/min (by C-G formula based on Cr of 1.47).   Medications:  Scheduled:    . amLODipine  5 mg Oral Daily  . calcium-vitamin D  1 tablet Oral BID  . docusate sodium  100 mg Oral BID  . enoxaparin  30 mg Subcutaneous Q12H  . isosorbide mononitrate  120 mg Oral Daily  . metoprolol  50 mg Oral Daily  . multivitamins ther. w/minerals  1 tablet Oral Daily  . ranolazine  500 mg Oral BID  . simvastatin  40 mg Oral Daily  . warfarin  5 mg Oral ONCE-1800  . DISCONTD: warfarin  7.5 mg Oral ONCE-1800    Assessment: INR 1.95. Goal INR 1.5-2 per  Dr. Turner Daniels. INR increased  from 1.05 after 2days of coumadin.  77 yo male POD#2 s/p Rt. TKA. Denies sob, CP or calf pain. No bleeding reported. Hgb 9.8, pltc 253.   Goal of Therapy: 1.5-2     Plan: Coumadin 1mg  po tonight. INR daily.  DC Lovenox today since INR is >1.8  Arman Filter 11/22/2010,12:07 PM

## 2010-11-22 NOTE — Progress Notes (Signed)
11/22/10 1004 Vance Peper, RN BSN Case Manager [phone] Patient will be going home with wife. [facility] will provide home health needs. DME has been ordered.

## 2010-11-23 ENCOUNTER — Encounter (HOSPITAL_COMMUNITY): Payer: Self-pay | Admitting: Orthopedic Surgery

## 2010-11-23 LAB — PROTIME-INR
INR: 2 — ABNORMAL HIGH (ref 0.00–1.49)
Prothrombin Time: 23 seconds — ABNORMAL HIGH (ref 11.6–15.2)

## 2010-11-23 LAB — CBC
Hemoglobin: 9.4 g/dL — ABNORMAL LOW (ref 13.0–17.0)
Platelets: 244 10*3/uL (ref 150–400)
RBC: 3.3 MIL/uL — ABNORMAL LOW (ref 4.22–5.81)
WBC: 9.5 10*3/uL (ref 4.0–10.5)

## 2010-11-23 MED ORDER — METHOCARBAMOL 500 MG PO TABS: 500.0000 mg | ORAL_TABLET | Freq: Four times a day (QID) | ORAL | Status: AC | PRN

## 2010-11-23 MED ORDER — WARFARIN SODIUM 2 MG PO TABS: ORAL_TABLET | ORAL | Status: DC

## 2010-11-23 MED ORDER — OXYCODONE-ACETAMINOPHEN 5-325 MG PO TABS
1.0000 | ORAL_TABLET | ORAL | Status: AC | PRN
Start: 2010-11-23 — End: 2010-12-03

## 2010-11-23 NOTE — Progress Notes (Signed)
ANTICOAGULATION CONSULT NOTE - Follow Up Consult  Pharmacy Consult for Coumadin Indication: VTE prophylaxis  Allergies  Allergen Reactions  . Contrast Media (Iodinated Diagnostic Agents) Hives    Patient Measurements: Height: 5\' 9"  (175.3 cm) Weight: 158 lb 8 oz (71.895 kg) IBW/kg (Calculated) : 70.7    Vital Signs: Temp: 98.5 F (36.9 C) (11/15 0634) Pulse Rate: 69  (11/15 0634)  Labs:  Basename 11/23/10 0605 11/22/10 0732 11/21/10 0615  HGB 9.4* 9.8* --  HCT 28.5* 28.9* 29.9*  PLT 244 253 231  APTT -- -- --  LABPROT 23.0* 22.6* 13.9  INR 2.00* 1.95* 1.05  HEPARINUNFRC -- -- --  CREATININE -- -- 1.47*  CKTOTAL -- -- --  CKMB -- -- --  TROPONINI -- -- --   Estimated Creatinine Clearance: 42.8 ml/min (by C-G formula based on Cr of 1.47).    Assessment: INR =2.0 ; No bleeding or complications noted. Pt to be discharged today on coumadin x 2 weeek for vte prophylaxis.   Goal of Therapy:  INR =1.5-2   Plan:  Coumadin discharge education done today. Patient  verbalized understanding. [facility] RN/Pharmacist to monitor INR and coumadin dose adjustments per protocol x 2weeks with 1st INR due 11/24/10. Discharge coumadin dose is 2mg  daily per Dr. Turner Daniels.   Arman Filter 11/23/2010,10:24 AM

## 2010-11-23 NOTE — Progress Notes (Signed)
Physical Therapy Treatment Patient Details Name: [name] MRN: [id number] DOB: August 25, 1932 Today's Date: 11/23/2010  PT Assessment/Plan  PT - Assessment/Plan Comments on Treatment Session: Pt progressing well. Eager to DC home today.  PT Plan: Discharge plan remains appropriate PT Frequency: 7X/week Follow Up Recommendations: Home health PT Equipment Recommended: Rolling walker with 5" wheels;3 in 1 bedside comode PT Goals  Acute Rehab PT Goals PT Goal: Sit to Supine/Side - Progress: Progressing toward goal PT Transfer Goal: Sit to Stand/Stand to Sit - Progress: Progressing toward goal PT Goal: Ambulate - Progress: Progressing toward goal PT Goal: Perform Home Exercise Program - Progress: Progressing toward goal  PT Treatment Precautions/Restrictions  Precautions Precautions: Knee Restrictions Weight Bearing Restrictions: Yes RLE Weight Bearing: Weight bearing as tolerated Other Position/Activity Restrictions: self monitor for activity tolerance Mobility (including Balance) Bed Mobility Bed Mobility: Yes Supine to Sit: 4: Min assist Supine to Sit Details (indicate cue type and reason): A for RLE Sitting - Scoot to Edge of Bed: 6: Modified independent (Device/Increase time) Transfers Transfers: Yes Sit to Stand: Other (comment);From chair/3-in-1 (MinGuard A) Sit to Stand Details (indicate cue type and reason): Cues for safe hand placement Stand to Sit: 5: Supervision Stand to Sit Details: Cues to slide out R LE Ambulation/Gait Ambulation/Gait: Yes Ambulation/Gait Assistance: 5: Supervision Ambulation/Gait Assistance Details (indicate cue type and reason): Cues for posture and to look forward Ambulation Distance (Feet): 210 Feet Assistive device: Rolling walker Gait Pattern: Step-through pattern;Decreased step length - right;Decreased step length - left;Trunk flexed Gait velocity: decreased Stairs: No Wheelchair Mobility Wheelchair Mobility: No    Exercise    Total Joint Exercises Quad Sets: AROM;Strengthening;Right;10 reps;Supine Short Arc Quad: AAROM;Strengthening;Right;10 reps;Supine Heel Slides: AAROM;Strengthening;Right;10 reps;Supine Hip ABduction/ADduction: AAROM;Strengthening;Right;10 reps;Supine Straight Leg Raises: AAROM;Strengthening;Right;10 reps;Supine End of Session PT - End of Session Equipment Utilized During Treatment: Gait belt Activity Tolerance: Patient tolerated treatment well Patient left: in chair;with call bell in reach;with family/visitor present General Behavior During Session: [facility] for tasks performed Cognition: [facility] for tasks performed  Fredrich Birks 11/23/2010, 10:34 AM 11/23/2010 Fredrich Birks PTA [phone] pager [phone] office

## 2010-11-23 NOTE — Discharge Summary (Signed)
Patient ID: CREW GOREN MRN: [id number] DOB/AGE: March 21, 1932 77 y.o.  Admit date: 11/20/2010 Discharge date: 11/23/2010  Admission Diagnoses:  Principal Problem:  *Osteoarthritis of right knee   Discharge Diagnoses:  Same  Past Medical History  Diagnosis Date  . Coronary artery disease   . Angina   . Hypertension   . Heart murmur   . Myocardial infarction     PT SEES DR SMITH REQ STRESS, ECHO  . Peripheral vascular disease     PT HAS 2 STENTS  . Diabetes mellitus     BORDERLINE  NO CURRENT MEDS  . Cancer     SKIN CANCERS REMOVED   . Arthritis     DJD    Surgeries: Procedure(s): TOTAL KNEE ARTHROPLASTY on 11/20/2010   Consultants:    Discharged Condition: Improved  Hospital Course: JAS BETTEN is an 77 y.o. male who was admitted 11/20/2010 for operative treatment ofOsteoarthritis of right knee. Patient has severe unremitting pain that affects sleep, daily activities, and work/hobbies. After pre-op clearance the patient was taken to the operating room on 11/20/2010 and underwent  Procedure(s): TOTAL KNEE ARTHROPLASTY.    Patient was given perioperative antibiotics: Anti-infectives     Start     Dose/Rate Route Frequency Ordered Stop   11/20/10 2045   ceFAZolin (ANCEF) IVPB 1 g/50 mL premix        1 g 100 mL/hr over 30 Minutes Intravenous  Once 11/20/10 1935 11/20/10 2227   11/20/10 1356   cefUROXime (ZINACEF) injection  Status:  Discontinued          As needed 11/20/10 1357 11/20/10 1454           Patient was given sequential compression devices, early ambulation, and chemoprophylaxis to prevent DVT.  Patient benefited maximally from hospital stay and there were no complications.    Recent vital signs: Patient Vitals for the past 24 hrs:  BP Temp Temp src Pulse Resp SpO2  11/23/10 0634 - 98.5 F (36.9 C) - 69  18  93 %  11/22/10 2200 144/50 mmHg 98.6 F (37 C) - 65  18  96 %  11/22/10 1300 91/48 mmHg 99.1 F (37.3 C) Oral 57  18  95 %      Recent laboratory studies:  Basename 11/22/10 0732 December 16, 2010 0615  WBC 9.8 7.8  HGB 9.8* 9.8*  HCT 28.9* 29.9*  PLT 253 231  NA -- 135  K -- 4.1  CL -- 100  CO2 -- 25  BUN -- 20  CREATININE -- 1.47*  GLUCOSE -- 146*  INR 1.95* 1.05  CALCIUM -- 9.0     Discharge Medications:   Current Discharge Medication List    START taking these medications   Details  methocarbamol (ROBAXIN) 500 MG tablet Take 1 tablet (500 mg total) by mouth every 6 (six) hours as needed. Qty: 60 tablet, Refills: 1    oxyCODONE-acetaminophen (PERCOCET) 5-325 MG per tablet Take 1-2 tablets by mouth every 4 (four) hours as needed. Qty: 60 tablet, Refills: 0    warfarin (COUMADIN) 2 MG tablet 2mg  tablet, take as directed based on INR blood work Qty: 40 tablet, Refills: 0      CONTINUE these medications which have NOT CHANGED   Details  amLODipine (NORVASC) 5 MG tablet Take 5 mg by mouth daily.      atorvastatin (LIPITOR) 40 MG tablet Take 40 mg by mouth daily.      calcium-vitamin D (OSCAL WITH D) 500-200 MG-UNIT per tablet  Take 1 tablet by mouth 2 (two) times daily.      isosorbide mononitrate (IMDUR) 120 MG 24 hr tablet Take 120 mg by mouth daily.      metoprolol (LOPRESSOR) 50 MG tablet Take 50 mg by mouth daily.      multivitamin (THERAGRAN) per tablet Take 1 tablet by mouth daily.      ranolazine (RANEXA) 500 MG 12 hr tablet Take 500 mg by mouth 2 (two) times daily.        STOP taking these medications     aspirin 325 MG EC tablet         Diagnostic Studies: Dg Chest 2 View  11/10/2010  *RADIOLOGY REPORT*  Clinical Data: Right total knee arthroplasty.  Preop radiograph.  CHEST - 2 VIEW  Comparison: 10/13/2009  Findings: The patient is status post median sternotomy and CABG procedure.  There is no pleural effusion or pulmonary edema identified.  The no airspace consolidation is identified.  Coarsened interstitial markings are noted bilaterally appear  Review of the visualized osseous  structures is unremarkable.  IMPRESSION:  1.  No active cardiopulmonary abnormalities.  Original Report Authenticated By: Rosealee Albee, M.D.    Disposition:   Discharge Orders    Future Orders Please Complete By Expires   Diet - low sodium heart healthy      Call MD / Call 911      Comments:   If you experience chest pain or shortness of breath, CALL 911 and be transported to the hospital emergency room.  If you develope a fever above 101 F, pus (white drainage) or increased drainage or redness at the wound, or calf pain, call your surgeon's office.   Constipation Prevention      Comments:   Drink plenty of fluids.  Prune juice may be helpful.  You may use a stool softener, such as Colace (over the counter) 100 mg twice a day.  Use MiraLax (over the counter) for constipation as needed.   Increase activity slowly as tolerated      Weight Bearing as taught in Physical Therapy      Comments:   Use a walker or crutches as instructed.   Patient may shower      Comments:   You may shower without a dressing once there is no drainage.  Do not wash over the wound.  If drainage remains, cover wound with plastic wrap and then shower.   Driving restrictions      Comments:   No driving for 2 weeks   CPM      Comments:   Continuous passive motion machine (CPM):      Use the CPM from 0 to 60 for 5 hours per day.      You may increase by 10 degrees per day.  You may break it up into 2 or 3 sessions per day.      Use CPM for 2 weeks or until you are told to stop.   Change dressing      Comments:   Change dressing on POD #5.  You may clean the incision with alcohol prior to redressing.   Do not put a pillow under the knee. Place it under the heel.      Discharge instructions      Comments:   [facility] for PT, CPM, Wound care, draw INR for coumadin x 2wks, RTC in 6 days for staples out.      Follow-up Information  Follow up with Emillie Chasen J in 6 days.   Contact information:    [facility] [address] [place] [address] [phone]           Signed: Nestor Lewandowsky 11/23/2010, 6:56 AM

## 2010-11-23 NOTE — Progress Notes (Signed)
Occupational Therapy Treatment Patient Details Name: [name] MRN: [id number] DOB: 09/13/1932 Today's Date: 11/23/2010  OT Assessment/Plan OT Assessment/Plan Comments on Treatment Session: Pt required occasional VC for safe use of RW, hand placement, and step sequence. Pt. fatigues with prolonged exertion. OT Plan: Discharge plan remains appropriate OT Frequency: Min 2X/week Follow Up Recommendations: Home health OT Equipment Recommended: Rolling walker with 5" wheels;3 in 1 bedside comode OT Goals Acute Rehab OT Goals OT Goal Formulation: With patient Time For Goal Achievement: 2 weeks ADL Goals ADL Goal: Lower Body Dressing - Progress: Progressing toward goals  OT Treatment Precautions/Restrictions  Precautions Precautions: Knee Restrictions Weight Bearing Restrictions: Yes RLE Weight Bearing: Weight bearing as tolerated   ADL ADL Eating/Feeding: Not assessed Grooming: Not assessed Upper Body Bathing: Not assessed Lower Body Bathing: Simulated;Set up Where Assessed - Lower Body Bathing: Sit to stand from chair Upper Body Dressing: Performed;Set up Where Assessed - Upper Body Dressing: Sitting, chair Lower Body Dressing: Set up Where Assessed - Lower Body Dressing: Sit to stand from chair Toilet Transfer: Not assessed Toileting - Clothing Manipulation: Not assessed Toileting - Hygiene: Not assessed Tub/Shower Transfer: Not assessed Equipment Used: Rolling walker ADL Comments: Educated Pt./Wife on techniques for dressing LB to conserve energy by sitting sideways on the bed and sitting in a chair and donning underwear and pants up over knees and then put on shoes and socks on before standing to pull up pants all the way. Educated pt/wife to thread pants and underwear on operated leg first and take off of operated leg last.  Mobility  Bed Mobility Bed Mobility: No Supine to Sit: 4: Min assist Supine to Sit Details (indicate cue type and reason): A for  RLE Sitting - Scoot to Edge of Bed: 6: Modified independent (Device/Increase time) Transfers Transfers: Yes Sit to Stand: 5: Supervision Sit to Stand Details (indicate cue type and reason): Cues for safe hand placement Stand to Sit: 5: Supervision Stand to Sit Details: Cues to slide out R LE  End of Session OT - End of Session Equipment Utilized During Treatment: Gait belt Activity Tolerance: Patient tolerated treatment well Patient left: Other (comment) (left with PT in hall way) General Behavior During Session: [facility] for tasks performed Cognition: [facility] for tasks performed  Otis Peak OTS 11/23/2010, 1:38 PM  11/23/2010 Lucile Shutters   OTR/L Pager: [phone] Office: [phone] .

## 2010-11-23 NOTE — Progress Notes (Signed)
PATIENT ID: CANAAN HOLZER  MRN: [id number]  DOB/AGE:  April 20, 1932 / 77 y.o.  3 Days Post-Op Procedure(s) (LRB): TOTAL KNEE ARTHROPLASTY (Right)    PROGRESS NOTE Subjective: Patient is alert, oriented, no Nausea, no Vomiting, yes passing gas, no Bowel Movement. Taking PO solids. Denies SOB, Chest or Calf Pain. Using Incentive Spirometer, PAS in place. Ambulate 80 ft can do steps, CPM 0-70 Patient reports pain as 2 on 0-10 scale  .    Objective: Vital signs in last 24 hours: Filed Vitals:   11/21/10 2230 11/22/10 0606 11/22/10 1300 11/22/10 2200  BP: 148/64 171/73 91/48 144/50  Pulse: 73 79 57 65  Temp: 98.6 F (37 C) 99.5 F (37.5 C) 99.1 F (37.3 C) 98.6 F (37 C)  TempSrc:   Oral   Resp: 19 20 18 18   Height:      Weight:      SpO2: 92% 96% 95% 96%      Intake/Output from previous day: I/O last 3 completed shifts: In: 240 [P.O.:240] Out: 150 [Urine:150]   Intake/Output this shift:     LABORATORY DATA:  Basename 11/22/10 0732 11/21/10 0615  WBC 9.8 7.8  HGB 9.8* 9.8*  HCT 28.9* 29.9*  PLT 253 231  NA -- 135  K -- 4.1  CL -- 100  CO2 -- 25  BUN -- 20  CREATININE -- 1.47*  GLUCOSE -- 146*  INR 1.95* 1.05  CALCIUM -- 9.0    Examination: Neurologically intact ABD soft Neurovascular intact Sensation intact distally Intact pulses distally Dorsiflexion/Plantar flexion intact Incision: no drainage No cellulitis present Compartment soft}  Assessment:   3 Days Post-Op Procedure(s) (LRB): TOTAL KNEE ARTHROPLASTY (Right) ADDITIONAL DIAGNOSIS:  Hypertension  Plan: PT/OT WBAT, CPM 5/hrs day until ROM 0-90 degrees, then D/C CPM DVT Prophylaxis:  Lovenox\Coumadin bridge target INR 1.5-2.0 DISCHARGE PLAN: Home DISCHARGE NEEDS: HHPT, HHRN, CPM, Walker and 3-in-1 comode seat [facility]     Zaidee Rion J 11/23/2010, 6:31 AM

## 2011-06-09 HISTORY — PX: JOINT REPLACEMENT: SHX530

## 2011-08-03 ENCOUNTER — Other Ambulatory Visit: Payer: Self-pay

## 2011-08-03 DIAGNOSIS — Z48812 Encounter for surgical aftercare following surgery on the circulatory system: Secondary | ICD-10-CM

## 2011-08-03 DIAGNOSIS — I739 Peripheral vascular disease, unspecified: Secondary | ICD-10-CM

## 2011-08-08 ENCOUNTER — Encounter: Payer: Self-pay | Admitting: Neurosurgery

## 2011-08-09 ENCOUNTER — Encounter: Payer: Self-pay | Admitting: Neurosurgery

## 2011-08-09 ENCOUNTER — Ambulatory Visit (INDEPENDENT_AMBULATORY_CARE_PROVIDER_SITE_OTHER): Payer: Medicare Other | Admitting: Vascular Surgery

## 2011-08-09 ENCOUNTER — Ambulatory Visit (INDEPENDENT_AMBULATORY_CARE_PROVIDER_SITE_OTHER): Payer: Medicare Other | Admitting: Neurosurgery

## 2011-08-09 VITALS — BP 155/72 | HR 58 | Resp 16 | Ht 69.0 in | Wt 161.2 lb

## 2011-08-09 DIAGNOSIS — Z48812 Encounter for surgical aftercare following surgery on the circulatory system: Secondary | ICD-10-CM

## 2011-08-09 DIAGNOSIS — I739 Peripheral vascular disease, unspecified: Secondary | ICD-10-CM

## 2011-08-09 DIAGNOSIS — I6529 Occlusion and stenosis of unspecified carotid artery: Secondary | ICD-10-CM

## 2011-08-09 NOTE — Progress Notes (Signed)
ABI performed @ [facility] 08/09/2011

## 2011-08-09 NOTE — Progress Notes (Signed)
LLE arterial duplex performed @ [facility] 08/09/2011

## 2011-08-09 NOTE — Progress Notes (Signed)
[facility] Office Note  CC: ABIs and left lower extremity graft duplex Referring Physician: Early  History of Present Illness: 78 year old male patient of Dr. Arbie Cookey is status post staged carotid endarterectomies some years ago, also status post left SFA stenting in August 2009. The patient reports some claudication with ambulation for short distances and states if he rests for just a couple minutes he gets better. The patient denies rest pain or open ulcerations. The patient denies any new medical diagnoses or recent surgeries.  Past Medical History  Diagnosis Date  . Coronary artery disease   . Angina   . Hypertension   . Heart murmur   . Myocardial infarction     PT SEES DR SMITH REQ STRESS, ECHO  . Peripheral vascular disease     PT HAS 2 STENTS  . Diabetes mellitus     BORDERLINE  NO CURRENT MEDS  . Cancer     SKIN CANCERS REMOVED   . Arthritis     DJD    ROS: [x]  Positive   [ ]  Denies    General: [ ]  Weight loss, [ ]  Fever, [ ]  chills Neurologic: [ ]  Dizziness, [ ]  Blackouts, [ ]  Seizure [ ]  Stroke, [ ]  "Mini stroke", [ ]  Slurred speech, [ ]  Temporary blindness; [ ]  weakness in arms or legs, [ ]  Hoarseness Cardiac: [ ]  Chest pain/pressure, [ ]  Shortness of breath at rest [ ]  Shortness of breath with exertion, [ ]  Atrial fibrillation or irregular heartbeat Vascular: [ ]  Pain in legs with walking, [ ]  Pain in legs at rest, [ ]  Pain in legs at night,  [ ]  Non-healing ulcer, [ ]  Blood clot in vein/DVT,   Pulmonary: [ ]  Home oxygen, [ ]  Productive cough, [ ]  Coughing up blood, [ ]  Asthma,  [ ]  Wheezing Musculoskeletal:  [ ]  Arthritis, [ ]  Low back pain, [ ]  Joint pain Hematologic: [ ]  Easy Bruising, [ ]  Anemia; [ ]  Hepatitis Gastrointestinal: [ ]  Blood in stool, [ ]  Gastroesophageal Reflux/heartburn, [ ]  Trouble swallowing Urinary: [ ]  chronic Kidney disease, [ ]  on HD - [ ]  MWF or [ ]  TTHS, [ ]  Burning with urination, [ ]  Difficulty  urinating Skin: [ ]  Rashes, [ ]  Wounds Psychological: [ ]  Anxiety, [ ]  Depression   Social History History  Substance Use Topics  . Smoking status: Former Games developer  . Smokeless tobacco: Not on file  . Alcohol Use: No    Family History Family History  Problem Relation Age of Onset  . Kidney disease Mother   . Heart disease Father     Heart Disease before age 43    Allergies  Allergen Reactions  . Contrast Media (Iodinated Diagnostic Agents) Hives    Current Outpatient Prescriptions  Medication Sig Dispense Refill  . amLODipine (NORVASC) 5 MG tablet Take 5 mg by mouth daily.        Marland Kitchen atorvastatin (LIPITOR) 40 MG tablet Take 40 mg by mouth daily.        . calcium-vitamin D (OSCAL WITH D) 500-200 MG-UNIT per tablet Take 1 tablet by mouth 2 (two) times daily.        . isosorbide mononitrate (IMDUR) 120 MG 24 hr tablet Take 120 mg by mouth daily.        . metoprolol (LOPRESSOR) 50 MG tablet Take 50 mg by mouth daily.        . multivitamin (THERAGRAN) per tablet Take 1 tablet  by mouth daily.        . ranolazine (RANEXA) 500 MG 12 hr tablet Take 500 mg by mouth 2 (two) times daily.        Marland Kitchen warfarin (COUMADIN) 2 MG tablet 2mg  tablet, take as directed based on INR blood work  40 tablet  0    Physical Examination  Filed Vitals:   08/09/11 1615  BP: 155/72  Pulse: 58  Resp: 16    Body mass index is 23.81 kg/(m^2).  General:  WDWN in NAD Gait: Normal HEENT: WNL Eyes: Pupils equal Pulmonary: normal non-labored breathing , without Rales, rhonchi,  wheezing Cardiac: RRR, without  Murmurs, rubs or gallops; No carotid bruits Abdomen: soft, NT, no masses Skin: no rashes, ulcers noted Vascular Exam/Pulses: Palpable femoral and PT and DP pulses bilaterally  Extremities without ischemic changes, no Gangrene , no cellulitis; no open wounds;  Musculoskeletal: no muscle wasting or atrophy  Neurologic: A&O X 3; Appropriate Affect ; SENSATION: normal; MOTOR FUNCTION:  moving all  extremities equally. Speech is fluent/normal  Non-Invasive Vascular Imaging: ABIs today are 0.88 and triphasic on the right, 0.84 and triphasic on the left which is a very slight decreased from previous ABIs 2 years ago.  ASSESSMENT/PLAN: Patient with mild claudication that declines any further diagnostics or intervention at this time. I spoke with Dr. Darrick Penna regarding the ABI report today and his recommendation is the patient return in one year for repeat studies. The patient's in agreement with this plan, his questions were encouraged and answered. Next  Lauree Chandler ANP  Clinic M.D.: [place]

## 2011-08-09 NOTE — Procedures (Unsigned)
LOWER EXTREMITY ARTERIAL DUPLEX  INDICATION:  Peripheral vascular disease.  HISTORY: Diabetes:  Yes. Cardiac:  CAD, stent. Hypertension:  Yes. Smoking:  Previous. Previous Surgery:  Left SFA stent 08/12/2007; left PSA repair 08/22/2007.  SINGLE LEVEL ARTERIAL EXAM                         RIGHT                LEFT Brachial: Anterior tibial: Posterior tibial: Peroneal: Ankle/Brachial Index:   0.88                 0.84  PREVIOUS ANKLE BRACHIAL INDEX:  12/19/2009; right 1.01, left 0.91  TOE BRACHIAL INDEX:  Right 0.67, left 0.73  LOWER EXTREMITY ARTERIAL DUPLEX EXAM   DUPLEX:  Lower extremity arterial duplex exam.  Elevated velocity present involving the right common femoral artery and proximal superficial femoral artery stent at the mid thigh segment suggestive of greater than 50% stenosis.  IMPRESSION: 1. Native artery stenosis as noted above. 2. Left superficial femoral artery stent patent with elevated velocity     suggesting stenosis as noted above and on  our worksheet. 3. Bilateral ankle brachial indices are in the mild claudication     range. 4. Right toe brachial index is considered abnormal. 5. Left toe brachial index is considered normal. 6. Decrease in the ankle brachial indices since previous study on     12/19/2009.  ___________________________________________ [name], M.D.  SH/MEDQ  D:  08/09/2011  T:  08/09/2011  Job:  [id number]

## 2011-08-10 NOTE — Addendum Note (Signed)
Addended by: Sharee Pimple on: 08/10/2011 09:17 AM   Modules accepted: Orders

## 2011-08-13 ENCOUNTER — Ambulatory Visit (INDEPENDENT_AMBULATORY_CARE_PROVIDER_SITE_OTHER): Payer: Self-pay | Admitting: Surgery

## 2012-03-18 ENCOUNTER — Ambulatory Visit
Admission: RE | Admit: 2012-03-18 | Discharge: 2012-03-18 | Disposition: A | Discharge status: Home / Self Care | Payer: Medicare Other | Source: Ambulatory Visit | Attending: Internal Medicine | Admitting: Internal Medicine

## 2012-03-18 ENCOUNTER — Other Ambulatory Visit: Payer: Self-pay | Admitting: Internal Medicine

## 2012-03-26 ENCOUNTER — Ambulatory Visit
Admission: RE | Admit: 2012-03-26 | Discharge: 2012-03-26 | Disposition: A | Discharge status: Home / Self Care | Payer: Medicare Other | Source: Ambulatory Visit | Attending: Internal Medicine | Admitting: Internal Medicine

## 2012-03-26 MED ORDER — GADOBENATE DIMEGLUMINE 529 MG/ML IV SOLN: 12.0000 mL | Freq: Once | INTRAVENOUS | Status: DC | PRN

## 2012-05-19 ENCOUNTER — Telehealth: Payer: Self-pay

## 2012-05-19 NOTE — Telephone Encounter (Signed)
Pt called to report bilateral, posterior legs hurting with walking uphill.  States the pain occurs approximately 4" above heel, up to approx. 6" below the knee, and is very uncomfortable.  States he has to  rest about 1-2 hrs before the pain completely subsides.  Also c/o "several little veins that start about 3 1/2 inches above the ankles, and go down and wrap under the foot, that "pop and burn and sting".   Denies any open sores.  Denies rest pain.  Admits to some "tingling in ankles" with activity, when questioned about change in sensation.  States he usually gets evaluated yearly, and asking about being seen sooner.

## 2012-05-19 NOTE — Telephone Encounter (Signed)
Discussed pt's symptoms with Dr. Myra Gianotti.  Recommends to have pt. Scheduled earlier for ABI's and bilat. Lower extremity art. Duplex to check stent patency.

## 2012-05-20 ENCOUNTER — Encounter (INDEPENDENT_AMBULATORY_CARE_PROVIDER_SITE_OTHER): Payer: Medicare Other | Admitting: *Deleted

## 2012-05-20 ENCOUNTER — Other Ambulatory Visit (INDEPENDENT_AMBULATORY_CARE_PROVIDER_SITE_OTHER): Payer: Medicare Other | Admitting: *Deleted

## 2012-05-20 ENCOUNTER — Encounter: Payer: Self-pay | Admitting: Vascular Surgery

## 2012-05-20 ENCOUNTER — Ambulatory Visit (INDEPENDENT_AMBULATORY_CARE_PROVIDER_SITE_OTHER): Payer: Medicare Other | Admitting: Vascular Surgery

## 2012-05-20 ENCOUNTER — Other Ambulatory Visit: Payer: Self-pay | Admitting: *Deleted

## 2012-05-20 VITALS — BP 155/74 | HR 54 | Resp 16 | Ht 68.0 in | Wt 152.0 lb

## 2012-05-20 DIAGNOSIS — I739 Peripheral vascular disease, unspecified: Secondary | ICD-10-CM

## 2012-05-20 DIAGNOSIS — M79609 Pain in unspecified limb: Secondary | ICD-10-CM

## 2012-05-20 DIAGNOSIS — I6529 Occlusion and stenosis of unspecified carotid artery: Secondary | ICD-10-CM

## 2012-05-20 DIAGNOSIS — Z48812 Encounter for surgical aftercare following surgery on the circulatory system: Secondary | ICD-10-CM

## 2012-05-20 NOTE — Progress Notes (Signed)
Patient presents today for followup of diffuse peripheral vascular occlusive disease. He is status post bilateral iliac artery stenting and also bilateral superficial femoral artery stenting in 2005. He also is status post right carotid endarterectomy in 2005 left carotid endarterectomy in 2003 with no neurologic deficits. He did have an episode of vertigo which resolved spontaneously. He reports that he had one event when he was walking uphill in his driveway quickly that he had cramping in his right calf which was relieved by rest. Normal he had a normal pace he does not have any symptoms associated with this. He does not have any tissue loss and does not have any rest pain. He did have a total knee replacement in 2011 with no complications following this. He denies any amaurosis fugax, strains and ischemic attack or stroke.  Past Medical History  Diagnosis Date  . Coronary artery disease   . Angina   . Hypertension   . Heart murmur   . Myocardial infarction     PT SEES DR SMITH REQ STRESS, ECHO  . Peripheral vascular disease     PT HAS 2 STENTS  . Diabetes mellitus     BORDERLINE  NO CURRENT MEDS  . Cancer     SKIN CANCERS REMOVED   . Arthritis     DJD    History  Substance Use Topics  . Smoking status: Former Smoker    Types: Cigarettes    Quit date: 05/20/1997  . Smokeless tobacco: Never Used  . Alcohol Use: No    Family History  Problem Relation Age of Onset  . Kidney disease Mother   . Heart disease Father     Heart Disease before age 18    Allergies  Allergen Reactions  . Contrast Media (Iodinated Diagnostic Agents) Hives    Current outpatient prescriptions:amLODipine (NORVASC) 5 MG tablet, Take 5 mg by mouth daily.  , Disp: , Rfl: ;  atorvastatin (LIPITOR) 40 MG tablet, Take 40 mg by mouth daily.  , Disp: , Rfl: ;  calcium-vitamin D (OSCAL WITH D) 500-200 MG-UNIT per tablet, Take 1 tablet by mouth 2 (two) times daily.  , Disp: , Rfl: ;  isosorbide mononitrate (IMDUR)  120 MG 24 hr tablet, Take 120 mg by mouth daily.  , Disp: , Rfl:  metoprolol (LOPRESSOR) 50 MG tablet, Take 50 mg by mouth daily.  , Disp: , Rfl: ;  multivitamin (THERAGRAN) per tablet, Take 1 tablet by mouth daily.  , Disp: , Rfl: ;  ranolazine (RANEXA) 500 MG 12 hr tablet, Take 500 mg by mouth 2 (two) times daily.  , Disp: , Rfl: ;  warfarin (COUMADIN) 2 MG tablet, 2mg  tablet, take as directed based on INR blood work, Disp: 40 tablet, Rfl: 0  BP 155/74  Pulse 54  Resp 16  Ht 5\' 8"  (1.727 m)  Wt 152 lb (68.947 kg)  BMI 23.12 kg/m2  Body mass index is 23.12 kg/(m^2).       Physical exam well-developed well-nourished white male no acute distress Carotid arteries show well-healed endarterectomies bilaterally with no bruits Pulse status: 2+ radial 2+ femoral pulses he does have palpable popliteal pulses. In without ulcers or rashes and no tissue loss on his lower extremity Neurologically grossly intact  Vascular lab studies: A carotid duplex revealed widely patent bilateral carotid endarterectomies with no evidence of stenosis there is probably some narrowing in his proximal right common carotid and subclavian artery potentially at the innominate artery. Lower surety studies reveal normal  ankle arm index normal triphasic waveforms bilaterally. Duplex imaging of his Stensen's SFA bilaterally suggest some mild to moderate narrowing in the mid SFA.  Impression and plan: Stable diffuse peripheral vascular occlusive disease. He does have stable right leg claudication. This is related to arterial insufficiency. Aside from the one event where he had significant discomfort on attempting to walk up to quickly he does not have any symptoms related to this. He was reassured that this is best approached with conservative observation only. He'll notify should he develop any worsening lower trim the symptoms or neurologic deficits. Otherwise he'll be seen again in one year for continued ongoing  surveillance

## 2012-05-27 ENCOUNTER — Other Ambulatory Visit: Payer: Self-pay | Admitting: *Deleted

## 2012-05-27 DIAGNOSIS — I739 Peripheral vascular disease, unspecified: Secondary | ICD-10-CM

## 2012-05-27 DIAGNOSIS — Z48812 Encounter for surgical aftercare following surgery on the circulatory system: Secondary | ICD-10-CM

## 2012-08-12 ENCOUNTER — Ambulatory Visit: Payer: Medicare Other | Admitting: Vascular Surgery

## 2012-08-12 ENCOUNTER — Other Ambulatory Visit: Payer: Medicare Other

## 2012-10-13 ENCOUNTER — Encounter: Payer: Self-pay | Admitting: Interventional Cardiology

## 2012-10-13 ENCOUNTER — Ambulatory Visit (INDEPENDENT_AMBULATORY_CARE_PROVIDER_SITE_OTHER): Payer: Medicare Other | Admitting: Interventional Cardiology

## 2012-10-13 ENCOUNTER — Telehealth: Payer: Self-pay | Admitting: Interventional Cardiology

## 2012-10-13 VITALS — BP 140/60 | HR 72 | Ht 68.5 in | Wt 148.0 lb

## 2012-10-13 DIAGNOSIS — R110 Nausea: Secondary | ICD-10-CM | POA: Insufficient documentation

## 2012-10-13 DIAGNOSIS — I25729 Atherosclerosis of autologous artery coronary artery bypass graft(s) with unspecified angina pectoris: Secondary | ICD-10-CM | POA: Insufficient documentation

## 2012-10-13 DIAGNOSIS — F0390 Unspecified dementia without behavioral disturbance: Secondary | ICD-10-CM | POA: Insufficient documentation

## 2012-10-13 DIAGNOSIS — I2510 Atherosclerotic heart disease of native coronary artery without angina pectoris: Secondary | ICD-10-CM

## 2012-10-13 DIAGNOSIS — R413 Other amnesia: Secondary | ICD-10-CM

## 2012-10-13 NOTE — Patient Instructions (Addendum)
Your physician recommends that you continue on your current medications as directed. Please refer to the Current Medication list given to you today.  Your physician recommends that you schedule a follow-up appointment in: 1 YEAR WITH DR.SMITH  FOLLOW UP WITH YOUR PCP DR.GRIFFIN

## 2012-10-13 NOTE — Telephone Encounter (Signed)
pt given appt today 10/13/12  at 2:45pm with Dr.Smith

## 2012-10-13 NOTE — Telephone Encounter (Signed)
New Problem  Pt is experiencing heaviness in his chest /// Nauseated and breaking out into sweats// requests a call back to discuss. Request a soon appt.Marland Kitchen

## 2012-10-13 NOTE — Progress Notes (Signed)
Patient ID: [name], male   DOB: Jul 13, 1932, 79 y.o.   MRN: [id number]    HPI  Allergies  Allergen Reactions  . Contrast Media [Iodinated Diagnostic Agents] Hives    Current Outpatient Prescriptions  Medication Sig Dispense Refill  . amLODipine (NORVASC) 5 MG tablet Take 5 mg by mouth daily.        Marland Kitchen aspirin 325 MG tablet Take 325 mg by mouth daily.      Marland Kitchen atorvastatin (LIPITOR) 40 MG tablet Take 40 mg by mouth daily.        . calcium-vitamin D (OSCAL WITH D) 500-200 MG-UNIT per tablet Take 1 tablet by mouth 2 (two) times daily.        . isosorbide mononitrate (IMDUR) 120 MG 24 hr tablet Take 120 mg by mouth daily.        . metoprolol (LOPRESSOR) 50 MG tablet Take 50 mg by mouth daily.        . multivitamin (THERAGRAN) per tablet Take 1 tablet by mouth daily.        . ranolazine (RANEXA) 500 MG 12 hr tablet Take 500 mg by mouth 2 (two) times daily.         No current facility-administered medications for this visit.    Past Medical History  Diagnosis Date  . Coronary artery disease   . Angina   . Hypertension   . Heart murmur   . Myocardial infarction     PT SEES DR Lashaunda Schild REQ STRESS, ECHO  . Peripheral vascular disease     PT HAS 2 STENTS  . Diabetes mellitus     BORDERLINE  NO CURRENT MEDS  . Cancer     SKIN CANCERS REMOVED   . Arthritis     DJD    Past Surgical History  Procedure Laterality Date  . Coronary artery bypass graft      79/84/93 CABG 95 CHEST HERNIA  . Cardiac catheterization    . Wrist surgery  2004    LEFT WRIST   . Hernia repair      CHEST AND HIATAL HERNIA SURGERY  . Cataract extraction, bilateral    . Total knee arthroplasty  11/20/2010    Procedure: TOTAL KNEE ARTHROPLASTY;  Surgeon: Nestor Lewandowsky;  Location: [facility];  Service: Orthopedics;  Laterality: Right;  RIGHT ARTHROPLASTY KNEE TOTAL  . Joint replacement  June 2013    RIGHT KNEE     ROS: There is great concern from the wife about memory loss. He has had some difficulty with  vertigo. Recurring nausea, but stable appetite. No palpitations, syncope, edema, transient neurological symptoms, or other complaints. PHYSICAL EXAM BP 140/60  Pulse 72  Ht 5' 8.5" (1.74 m)  Wt 148 lb (67.132 kg)  BMI 22.17 kg/m2  Frail appearing. No acute distress. Chest is clear to auscultation and percussion. 2/6 right upper sternal border. Systolic murmur. No diastolic murmur Scaphoid abdomen. Bowel sounds are normal. No focal neurological complaints.  EKG: Normal sinus rhythm, right bundle branch block, inferior infarct(old),no acute change.  ASSESSMENT AND PLAN  1. Coronary atherosclerotic heart disease, with angina responsive to nitroglycerin. No prolonged episodes and no episodes requiring multiple nitroglycerin tablets.  2. Recurring nausea of uncertain cause. The patient is advised to followup with his primary care physician concerning this complaint.  3. Memory loss, again, the patient is encouraged to followup with primary care to manage this.   Overall, there does not appear to be an acute cardiac problem. Today. Concerns  have more to do with recurring nausea and memory loss. I have encouraged him to get them with her primary physician as soon as possible.

## 2012-10-14 NOTE — Progress Notes (Signed)
The history was omitted:  HPI: The patient and wife call for an appointment today. Upon questioning the patient concern about many noncardiac issues. He has had 2 or 3 episodes of chest discomfort. Responsive to nitroglycerin. Over the past 3 months. Her greater concern was memory loss, nausea, and dizziness. He denies orthopnea, PND, exertional chest pain, palpitations, and syncope.

## 2013-02-25 ENCOUNTER — Other Ambulatory Visit: Payer: Self-pay | Admitting: Vascular Surgery

## 2013-02-25 DIAGNOSIS — I6529 Occlusion and stenosis of unspecified carotid artery: Secondary | ICD-10-CM

## 2013-02-25 DIAGNOSIS — Z48812 Encounter for surgical aftercare following surgery on the circulatory system: Secondary | ICD-10-CM

## 2013-02-27 ENCOUNTER — Other Ambulatory Visit: Payer: Self-pay | Admitting: *Deleted

## 2013-02-27 ENCOUNTER — Telehealth: Payer: Self-pay | Admitting: *Deleted

## 2013-02-27 MED ORDER — RANOLAZINE ER 500 MG PO TB12: 500.0000 mg | ORAL_TABLET | Freq: Two times a day (BID) | ORAL | Status: DC

## 2013-02-27 MED ORDER — ISOSORBIDE MONONITRATE ER 120 MG PO TB24: 120.0000 mg | ORAL_TABLET | Freq: Every day | ORAL | Status: DC

## 2013-04-23 ENCOUNTER — Other Ambulatory Visit: Payer: Self-pay

## 2013-04-24 ENCOUNTER — Other Ambulatory Visit: Payer: Self-pay

## 2013-04-24 MED ORDER — NITROGLYCERIN 0.4 MG SL SUBL: 0.4000 mg | SUBLINGUAL_TABLET | SUBLINGUAL | Status: DC | PRN

## 2013-05-25 ENCOUNTER — Encounter: Payer: Self-pay | Admitting: Vascular Surgery

## 2013-05-26 ENCOUNTER — Ambulatory Visit (INDEPENDENT_AMBULATORY_CARE_PROVIDER_SITE_OTHER): Payer: Commercial Managed Care - HMO | Admitting: Vascular Surgery

## 2013-05-26 ENCOUNTER — Ambulatory Visit (HOSPITAL_COMMUNITY)
Admission: RE | Admit: 2013-05-26 | Discharge: 2013-05-26 | Disposition: A | Discharge status: Home / Self Care | Payer: Medicare HMO | Source: Ambulatory Visit | Attending: Vascular Surgery | Admitting: Vascular Surgery

## 2013-05-26 ENCOUNTER — Encounter: Payer: Self-pay | Admitting: Vascular Surgery

## 2013-05-26 ENCOUNTER — Ambulatory Visit (INDEPENDENT_AMBULATORY_CARE_PROVIDER_SITE_OTHER)
Admission: RE | Admit: 2013-05-26 | Discharge: 2013-05-26 | Disposition: A | Discharge status: Home / Self Care | Payer: Medicare HMO | Source: Ambulatory Visit | Attending: Vascular Surgery | Admitting: Vascular Surgery

## 2013-05-26 VITALS — BP 151/70 | HR 57 | Ht 68.5 in | Wt 146.0 lb

## 2013-05-26 DIAGNOSIS — I6529 Occlusion and stenosis of unspecified carotid artery: Secondary | ICD-10-CM

## 2013-05-26 DIAGNOSIS — I739 Peripheral vascular disease, unspecified: Secondary | ICD-10-CM

## 2013-05-26 DIAGNOSIS — Z48812 Encounter for surgical aftercare following surgery on the circulatory system: Secondary | ICD-10-CM

## 2013-05-26 DIAGNOSIS — Z9889 Other specified postprocedural states: Secondary | ICD-10-CM | POA: Insufficient documentation

## 2013-05-26 NOTE — Addendum Note (Signed)
Addended by: Mena Goes on: 05/26/2013 01:17 PM   Modules accepted: Orders

## 2013-05-26 NOTE — Progress Notes (Signed)
Here today for followup of his diffuse peripheral vascular occlusive disease. He looks quite good today. He is here today with his wife. He denies any difficulty with walking and is a walking program. He quit smoking approximately 15 years ago. He has no symptoms of amaurosis fugax, transient ischemic attack or stroke.  Past Medical History  Diagnosis Date  . Coronary artery disease   . Angina   . Hypertension   . Heart murmur   . Myocardial infarction     PT SEES DR SMITH REQ STRESS, ECHO  . Peripheral vascular disease     PT HAS 2 STENTS  . Diabetes mellitus     BORDERLINE  NO CURRENT MEDS  . Cancer     SKIN CANCERS REMOVED   . Arthritis     DJD  . Stroke     History  Substance Use Topics  . Smoking status: Former Smoker    Types: Cigarettes    Quit date: 05/20/1997  . Smokeless tobacco: Never Used  . Alcohol Use: No    Family History  Problem Relation Age of Onset  . Kidney disease Mother   . Heart disease Father     Heart Disease before age 53  . Heart disease Sister   . Hypertension Sister     Allergies  Allergen Reactions  . Contrast Media [Iodinated Diagnostic Agents] Hives    Current outpatient prescriptions:amLODipine (NORVASC) 5 MG tablet, Take 5 mg by mouth daily.  , Disp: , Rfl: ;  aspirin 325 MG tablet, Take 325 mg by mouth daily., Disp: , Rfl: ;  atorvastatin (LIPITOR) 40 MG tablet, Take 40 mg by mouth daily.  , Disp: , Rfl: ;  calcium-vitamin D (OSCAL WITH D) 500-200 MG-UNIT per tablet, Take 1 tablet by mouth 2 (two) times daily.  , Disp: , Rfl:  isosorbide mononitrate (IMDUR) 120 MG 24 hr tablet, Take 1 tablet (120 mg total) by mouth daily., Disp: 90 tablet, Rfl: 3;  metoprolol (LOPRESSOR) 50 MG tablet, Take 50 mg by mouth daily.  , Disp: , Rfl: ;  multivitamin (THERAGRAN) per tablet, Take 1 tablet by mouth daily.  , Disp: , Rfl:  nitroGLYCERIN (NITROSTAT) 0.4 MG SL tablet, Place 1 tablet (0.4 mg total) under the tongue every 5 (five) minutes as needed  for chest pain., Disp: 25 tablet, Rfl: 1;  ranolazine (RANEXA) 500 MG 12 hr tablet, Take 1 tablet (500 mg total) by mouth 2 (two) times daily., Disp: 180 tablet, Rfl: 3  BP 151/70  Pulse 57  Ht 5' 8.5" (1.74 m)  Wt 146 lb (66.225 kg)  BMI 21.87 kg/m2  SpO2 100%  Body mass index is 21.87 kg/(m^2).       Physical exam well-developed well-nourished gentleman appearing stated age 80 He has a grossly intact neurologically with no deficits Skin without ulcers or rashes Carotid arteries without bruits bilaterally with well-healed neck incisions bilaterally 2+ radial pulses bilaterally Heart regular rate and rhythm Respirations equal nonlabored with no wheezes  Noninvasive studies today were reviewed with the patient. He has no evidence of carotid stenoses bilaterally Lower extremity studies reveal triphasic waveforms bilaterally with normal ankle arm index bilaterally  Impression and plan: Stable followup after staged bilateral carotid endarterectomies in 2003 in 2005. Also stable from iliac and superficial artery stenting from 10 years ago as well. We'll continue his walking program we'll see [place] again in one year for continued noninvasive followup

## 2013-06-17 ENCOUNTER — Ambulatory Visit: Payer: Medicare Other | Admitting: Interventional Cardiology

## 2013-10-15 ENCOUNTER — Ambulatory Visit (INDEPENDENT_AMBULATORY_CARE_PROVIDER_SITE_OTHER): Payer: Commercial Managed Care - HMO | Admitting: Interventional Cardiology

## 2013-10-15 ENCOUNTER — Encounter: Payer: Self-pay | Admitting: Interventional Cardiology

## 2013-10-15 VITALS — BP 140/72 | HR 53 | Ht 68.0 in | Wt 135.1 lb

## 2013-10-15 DIAGNOSIS — I739 Peripheral vascular disease, unspecified: Secondary | ICD-10-CM

## 2013-10-15 DIAGNOSIS — I10 Essential (primary) hypertension: Secondary | ICD-10-CM

## 2013-10-15 DIAGNOSIS — I25729 Atherosclerosis of autologous artery coronary artery bypass graft(s) with unspecified angina pectoris: Secondary | ICD-10-CM

## 2013-10-15 DIAGNOSIS — F0391 Unspecified dementia with behavioral disturbance: Secondary | ICD-10-CM

## 2013-10-15 NOTE — Patient Instructions (Signed)
Your physician recommends that you continue on your current medications as directed. Please refer to the Current Medication list given to you today.  Your physician wants you to follow-up in: 1 year. You will receive a reminder letter in the mail two months in advance. If you don't receive a letter, please call our office to schedule the follow-up appointment.  

## 2013-10-15 NOTE — Progress Notes (Signed)
Patient ID: [name], male   DOB: 27-Aug-1932, 80 y.o.   MRN: [id number]    [address], [address], [place]  [address] Phone: [phone] Fax:  [phone]  Date:  10/15/2013   ID:  [name], DOB 07/10/1932, MRN [id number]  PCP:  Irven Shelling, MD   ASSESSMENT:  1. Dementia, with agitation towards his wife 2. Coronary artery disease with occasional angina. History of bypass surgery 3. Hypertension, controlled 4. History of CVA 5. Right bundle branch block with evidence of old inferior infarct   PLAN:  1. No Sean in current cardiovascular care 2. We'll see him in one year or as needed   SUBJECTIVE: [name] is a 80 y.o. male who is doing poorly. He has obvious cognitive impairment. He has a difficult time in conversation. He tells me in one episode of grabbing chest pain that lasted less than 5 minutes. He perseverates on this event. He denies dyspnea. He has not had syncope. He did trip over a hose pipe and break his hip. He had to have hip replacement surgery.   Wt Readings from Last 3 Encounters:  10/15/13 135 lb 1.9 oz (61.29 kg)  05/26/13 146 lb (66.225 kg)  10/13/12 148 lb (67.132 kg)     Past Medical History  Diagnosis Date  . Coronary artery disease   . Angina   . Hypertension   . Heart murmur   . Myocardial infarction     PT SEES DR SMITH REQ STRESS, ECHO  . Peripheral vascular disease     PT HAS 2 STENTS  . Diabetes mellitus     BORDERLINE  NO CURRENT MEDS  . Cancer     SKIN CANCERS REMOVED   . Arthritis     DJD  . Stroke     Current Outpatient Prescriptions  Medication Sig Dispense Refill  . amLODipine (NORVASC) 5 MG tablet Take 5 mg by mouth daily.        Marland Kitchen aspirin 325 MG tablet Take 325 mg by mouth daily.      Marland Kitchen atorvastatin (LIPITOR) 40 MG tablet Take 40 mg by mouth daily.        . calcium-vitamin D (OSCAL WITH D) 500-200 MG-UNIT per tablet Take 1 tablet by mouth 2 (two) times daily.        .  isosorbide mononitrate (IMDUR) 120 MG 24 hr tablet Take 1 tablet (120 mg total) by mouth daily.  90 tablet  3  . metoprolol (LOPRESSOR) 50 MG tablet Take 50 mg by mouth daily.        . multivitamin (THERAGRAN) per tablet Take 1 tablet by mouth daily.        . nitroGLYCERIN (NITROSTAT) 0.4 MG SL tablet Place 1 tablet (0.4 mg total) under the tongue every 5 (five) minutes as needed for chest pain.  25 tablet  1  . ranolazine (RANEXA) 500 MG 12 hr tablet Take 1 tablet (500 mg total) by mouth 2 (two) times daily.  180 tablet  3   No current facility-administered medications for this visit.    Allergies:    Allergies  Allergen Reactions  . Contrast Media [Iodinated Diagnostic Agents] Hives    Social History:  The patient  reports that he quit smoking about 16 years ago. His smoking use included Cigarettes. He smoked 0.00 packs per day. He has never used smokeless tobacco. He reports that he does not drink alcohol or use illicit drugs.  ROS:  Please see the history of present illness.   Stable appetite; agitation towards his wife and she helps him in conversation . All other systems reviewed and negative.   OBJECTIVE: VS:  BP 140/72  Pulse 53  Ht 5\' 8"  (1.727 m)  Wt 135 lb 1.9 oz (61.29 kg)  BMI 20.55 kg/m2 Well nourished, well developed, in no acute distress, elderly and frail HEENT: normal Neck: JVD flat. Carotid bruit bilateral bruits  Cardiac:  normal S1, S2; RRR; no murmur Lungs:  clear to auscultation bilaterally, no wheezing, rhonchi or rales Abd: soft, nontender, no hepatomegaly Ext: Edema absent. Pulses trace to 1+ bilateral Skin: warm and dry Neuro:  CNs 2-12 intact, no focal abnormalities noted  EKG:     sinus bradycardia, right bundle branch block, inferior infarct.     Signed, Illene Labrador III, MD 10/15/2013 9:52 AM

## 2014-04-01 ENCOUNTER — Other Ambulatory Visit: Payer: Self-pay | Admitting: Interventional Cardiology

## 2014-05-13 ENCOUNTER — Other Ambulatory Visit: Payer: Self-pay | Admitting: Interventional Cardiology

## 2014-06-01 ENCOUNTER — Ambulatory Visit: Payer: Commercial Managed Care - HMO | Admitting: Family

## 2014-06-01 ENCOUNTER — Encounter (HOSPITAL_COMMUNITY): Payer: Commercial Managed Care - HMO

## 2014-09-04 ENCOUNTER — Emergency Department (HOSPITAL_COMMUNITY): Payer: Commercial Managed Care - HMO

## 2014-09-04 ENCOUNTER — Emergency Department (HOSPITAL_COMMUNITY)
Admission: EM | Admit: 2014-09-04 | Discharge: 2014-09-04 | Disposition: A | Discharge status: Home / Self Care | Payer: Commercial Managed Care - HMO | Attending: Emergency Medicine | Admitting: Emergency Medicine

## 2014-09-04 ENCOUNTER — Encounter (HOSPITAL_COMMUNITY): Payer: Self-pay | Admitting: Nurse Practitioner

## 2014-09-04 DIAGNOSIS — R319 Hematuria, unspecified: Secondary | ICD-10-CM | POA: Diagnosis present

## 2014-09-04 DIAGNOSIS — Z87891 Personal history of nicotine dependence: Secondary | ICD-10-CM | POA: Diagnosis not present

## 2014-09-04 DIAGNOSIS — M1990 Unspecified osteoarthritis, unspecified site: Secondary | ICD-10-CM | POA: Insufficient documentation

## 2014-09-04 DIAGNOSIS — I252 Old myocardial infarction: Secondary | ICD-10-CM | POA: Diagnosis not present

## 2014-09-04 DIAGNOSIS — E119 Type 2 diabetes mellitus without complications: Secondary | ICD-10-CM | POA: Diagnosis not present

## 2014-09-04 DIAGNOSIS — Z79899 Other long term (current) drug therapy: Secondary | ICD-10-CM | POA: Insufficient documentation

## 2014-09-04 DIAGNOSIS — Z8673 Personal history of transient ischemic attack (TIA), and cerebral infarction without residual deficits: Secondary | ICD-10-CM | POA: Diagnosis not present

## 2014-09-04 DIAGNOSIS — Z85828 Personal history of other malignant neoplasm of skin: Secondary | ICD-10-CM | POA: Diagnosis not present

## 2014-09-04 DIAGNOSIS — I10 Essential (primary) hypertension: Secondary | ICD-10-CM | POA: Insufficient documentation

## 2014-09-04 DIAGNOSIS — I25119 Atherosclerotic heart disease of native coronary artery with unspecified angina pectoris: Secondary | ICD-10-CM | POA: Insufficient documentation

## 2014-09-04 DIAGNOSIS — R011 Cardiac murmur, unspecified: Secondary | ICD-10-CM | POA: Insufficient documentation

## 2014-09-04 LAB — COMPREHENSIVE METABOLIC PANEL
ALT: 14 U/L — ABNORMAL LOW (ref 17–63)
AST: 24 U/L (ref 15–41)
Albumin: 3.5 g/dL (ref 3.5–5.0)
Alkaline Phosphatase: 44 U/L (ref 38–126)
Anion gap: 9 (ref 5–15)
BILIRUBIN TOTAL: 0.9 mg/dL (ref 0.3–1.2)
BUN: 36 mg/dL — AB (ref 6–20)
CHLORIDE: 105 mmol/L (ref 101–111)
CO2: 23 mmol/L (ref 22–32)
Calcium: 8.6 mg/dL — ABNORMAL LOW (ref 8.9–10.3)
Creatinine, Ser: 2.28 mg/dL — ABNORMAL HIGH (ref 0.61–1.24)
GFR, EST AFRICAN AMERICAN: 29 mL/min — AB (ref >60)
GFR, EST NON AFRICAN AMERICAN: 25 mL/min — AB (ref >60)
Glucose, Bld: 172 mg/dL — ABNORMAL HIGH (ref 65–99)
POTASSIUM: 4.6 mmol/L (ref 3.5–5.1)
Sodium: 137 mmol/L (ref 135–145)
TOTAL PROTEIN: 6.4 g/dL — AB (ref 6.5–8.1)

## 2014-09-04 LAB — URINALYSIS, ROUTINE W REFLEX MICROSCOPIC
GLUCOSE, UA: NEGATIVE mg/dL
Ketones, ur: 15 mg/dL — AB
Nitrite: POSITIVE — AB
SPECIFIC GRAVITY, URINE: 1.025 (ref 1.005–1.030)
Urobilinogen, UA: 1 mg/dL (ref 0.0–1.0)
pH: 5.5 (ref 5.0–8.0)

## 2014-09-04 LAB — CBC
HEMATOCRIT: 33 % — AB (ref 39.0–52.0)
Hemoglobin: 11.3 g/dL — ABNORMAL LOW (ref 13.0–17.0)
MCH: 31.5 pg (ref 26.0–34.0)
MCHC: 34.2 g/dL (ref 30.0–36.0)
MCV: 91.9 fL (ref 78.0–100.0)
PLATELETS: 208 10*3/uL (ref 150–400)
RBC: 3.59 MIL/uL — AB (ref 4.22–5.81)
RDW: 13.8 % (ref 11.5–15.5)
WBC: 9.7 10*3/uL (ref 4.0–10.5)

## 2014-09-04 LAB — URINE MICROSCOPIC-ADD ON

## 2014-09-04 MED ORDER — CEPHALEXIN 500 MG PO CAPS: 500.0000 mg | ORAL_CAPSULE | Freq: Two times a day (BID) | ORAL | Status: DC

## 2014-09-04 MED ORDER — DEXTROSE 5 % IV SOLN
1.0000 g | Freq: Once | INTRAVENOUS | Status: AC
  Administered 2014-09-04: 1 g via INTRAVENOUS
  Filled 2014-09-04: qty 10

## 2014-09-04 MED ORDER — SODIUM CHLORIDE 0.9 % IV BOLUS (SEPSIS)
1000.0000 mL | Freq: Once | INTRAVENOUS | Status: AC
  Administered 2014-09-04: 1000 mL via INTRAVENOUS

## 2014-09-04 NOTE — ED Notes (Signed)
[facility] blood in urine on lab work this am. Family took him for check up today after waking with abd pain and hematuria. He has dementia and family states he is acting normally today. He is alert and breathing easily

## 2014-09-04 NOTE — ED Provider Notes (Signed)
CSN: [id number]     Arrival date & time 09/04/14  1510 History   First MD Initiated Contact with Patient 09/04/14 1816     Chief Complaint  Patient presents with  . Hematuria     (Consider location/radiation/quality/duration/timing/severity/associated sxs/prior Treatment) HPI Comments: 81 year old male with extensive past medical history including CAD status post CABG, MI, hypertension, type 2 diabetes, CVA, PVD who presents with hematuria. History obtained with the assistance of the wife as the patient does have underlying dementia and word finding difficulty. Wife states that this morning the patient woke up and began having gross hematuria. He urinated normally throughout the night but today he has produced almost no urine. Patient denies any pain or difficulty with urination. No nausea, vomiting, or abdominal pain. Wife does report a long history of intermittent pain in his right inguinal area but he is not aware of any hernia there. He has not had any fevers, chest pain, or shortness of breath. He has a very remote history of kidney stones once previously but not recently.  Patient is a 81 y.o. male presenting with hematuria. The history is provided by the spouse and the patient.  Hematuria    Past Medical History  Diagnosis Date  . Coronary artery disease   . Angina   . Hypertension   . Heart murmur   . Myocardial infarction     PT SEES DR SMITH REQ STRESS, ECHO  . Peripheral vascular disease     PT HAS 2 STENTS  . Diabetes mellitus     BORDERLINE  NO CURRENT MEDS  . Cancer     SKIN CANCERS REMOVED   . Arthritis     DJD  . Stroke    Past Surgical History  Procedure Laterality Date  . Coronary artery bypass graft      79/84/93 CABG 95 CHEST HERNIA  . Cardiac catheterization    . Wrist surgery  2004    LEFT WRIST   . Hernia repair      CHEST AND HIATAL HERNIA SURGERY  . Cataract extraction, bilateral    . Total knee arthroplasty  11/20/2010    Procedure: TOTAL KNEE  ARTHROPLASTY;  Surgeon: Kerin Salen;  Location: [facility];  Service: Orthopedics;  Laterality: Right;  RIGHT ARTHROPLASTY KNEE TOTAL  . Joint replacement  June 2013    RIGHT KNEE    Family History  Problem Relation Age of Onset  . Kidney disease Mother   . Heart disease Father     Heart Disease before age 32  . Heart disease Sister   . Hypertension Sister    Social History  Substance Use Topics  . Smoking status: Former Smoker    Types: Cigarettes    Quit date: 05/20/1997  . Smokeless tobacco: Never Used  . Alcohol Use: No    Review of Systems  Genitourinary: Positive for hematuria.   10 Systems reviewed and are negative for acute change except as noted in the HPI.    Allergies  Contrast media  Home Medications   Prior to Admission medications   Medication Sig Start Date End Date Taking? Authorizing Provider  amLODipine (NORVASC) 5 MG tablet Take 5 mg by mouth daily.     Yes Historical Provider, MD  atorvastatin (LIPITOR) 40 MG tablet Take 40 mg by mouth every evening.    Yes Historical Provider, MD  calcium-vitamin D (OSCAL WITH D) 500-200 MG-UNIT per tablet Take 1 tablet by mouth 2 (two) times daily.  Yes Historical Provider, MD  citalopram (CELEXA) 10 MG tablet Take 5 mg by mouth daily.   Yes Historical Provider, MD  isosorbide mononitrate (IMDUR) 120 MG 24 hr tablet TAKE 1 TABLET EVERY DAY 04/02/14  Yes Belva Crome, MD  memantine (NAMENDA) 10 MG tablet Take 10 mg by mouth daily.   Yes Historical Provider, MD  metoprolol (LOPRESSOR) 50 MG tablet Take 50 mg by mouth daily.     Yes Historical Provider, MD  multivitamin [facility]) per tablet Take 1 tablet by mouth daily.     Yes Historical Provider, MD  NITROSTAT 0.4 MG SL tablet PLACE 1 TABLET (0.4 MG TOTAL) UNDER THE TONGUE EVERY 5 (FIVE) MINUTES AS NEEDED FOR CHEST PAIN. 05/14/14  Yes Belva Crome, MD  RANEXA 500 MG 12 hr tablet TAKE 1 TABLET TWICE DAILY 04/02/14  Yes Belva Crome, MD  cephALEXin (KEFLEX) 500 MG  capsule Take 1 capsule (500 mg total) by mouth 2 (two) times daily. 09/04/14   Wenda Overland Javonnie Illescas, MD   BP 164/60 mmHg  Pulse 49  Temp(Src) 98 F (36.7 C) (Oral)  Resp 16  Ht 5\' 8"  (1.727 m)  SpO2 100% Physical Exam  Constitutional: He is oriented to person, place, and time. He appears well-developed and well-nourished. No distress.  HENT:  Head: Normocephalic and atraumatic.  Moist mucous membranes  Eyes: Conjunctivae are normal. Pupils are equal, round, and reactive to light.  Neck: Neck supple.  Cardiovascular: Normal rate, regular rhythm and normal heart sounds.   No murmur heard. Pulmonary/Chest: Effort normal and breath sounds normal.  Abdominal: Soft. Bowel sounds are normal. He exhibits no distension. There is no tenderness.  Mild tenderness of R inguinal region with no associated RLQ tenderness, no obvious swelling to suggest hernia  Musculoskeletal: He exhibits no edema.  Neurological: He is alert and oriented to person, place, and time.  Some word finding difficulty  Skin: Skin is warm and dry.  Psychiatric: He has a normal mood and affect. Judgment normal.  Nursing note and vitals reviewed.   ED Course  Procedures (including critical care time) Labs Review Labs Reviewed  COMPREHENSIVE METABOLIC PANEL - Abnormal; Notable for the following:    Glucose, Bld 172 (*)    BUN 36 (*)    Creatinine, Ser 2.28 (*)    Calcium 8.6 (*)    Total Protein 6.4 (*)    ALT 14 (*)    GFR calc non Af Amer 25 (*)    GFR calc Af Amer 29 (*)    All other components within normal limits  CBC - Abnormal; Notable for the following:    RBC 3.59 (*)    Hemoglobin 11.3 (*)    HCT 33.0 (*)    All other components within normal limits  URINALYSIS, ROUTINE W REFLEX MICROSCOPIC (NOT AT [facility]) - Abnormal; Notable for the following:    Color, Urine RED (*)    APPearance TURBID (*)    Hgb urine dipstick LARGE (*)    Bilirubin Urine LARGE (*)    Ketones, ur 15 (*)    Protein, ur >300 (*)     Nitrite POSITIVE (*)    Leukocytes, UA LARGE (*)    All other components within normal limits  URINE MICROSCOPIC-ADD ON - Abnormal; Notable for the following:    Bacteria, UA FEW (*)    All other components within normal limits    Imaging Review Ct Abdomen Pelvis Wo Contrast  09/04/2014   CLINICAL DATA:  Mid abdominal pain and swelling, hematuria, question kidney stones, history coronary artery disease post MI and CABG, diabetes mellitus, hypertension, stroke  EXAM: CT ABDOMEN AND PELVIS WITHOUT CONTRAST  TECHNIQUE: Multidetector CT imaging of the abdomen and pelvis was performed following the standard protocol without IV contrast. Sagittal and coronal MPR images reconstructed from axial data set. Oral contrast not administered for this indication.  COMPARISON:  08/22/2007  FINDINGS: Emphysematous changes at the lung bases without infiltrate.  Extensive atherosclerotic calcifications aorta, coronary arteries, and iliac arteries.  Stents identified within the iliac systems bilaterally.  Vascular calcifications at kidneys without definite nephrolithiasis.  No hydronephrosis, ureteral calcification or ureteral dilatation.  Significant portions of the distal ureters and bladder are obscured by beam hardening artifacts from LEFT hip prosthesis.  Small fat attenuation focus at the posterior superior aspect of the liver.  Liver, spleen, pancreas, kidneys and adrenal glands otherwise unremarkable for technique.  Stomach and bowel loops grossly unremarkable for technique.  Normal appendix.  No mass, adenopathy, free air, or free fluid.  Diffuse osseous demineralization.  IMPRESSION: Extensive atherosclerotic disease changes.  COPD changes at lung bases.  No definite acute intra-abdominal or intrapelvic abnormalities.   Electronically Signed   By: Lavonia Dana M.D.   On: 09/04/2014 20:10   I have personally reviewed and evaluated these lab results as part of my medical decision-making.   EKG  Interpretation None      MDM   Final diagnoses:  Hematuria   81 year old male who presents with painless hematuria that began this morning. At presentation, the patient was awake, alert, and in no acute distress. Initial vital signs recorded low BP and elevated heart rate which were inconsistent w/ VS in room, which showed mild bradycardia and HTN. Patient without any abdominal tenderness on exam. No swelling in the inguinal region to suggest hernia. Obtained above lab work as well as a noncontrasted CT to evaluate for kidney stone.  Labs show creatinine of 2.28; we have no recent comparison data. Given the patient's significant vascular disease and hypertension, he may have chronic kidney disease. CBC shows anemia that is actually improved from labs in 2012. UA shows large amount of white blood cells and red blood cells, difficult to interpret given gross hematuria. Gave the patient an IV fluid bolus, after which he was able to void at least 400cc of yellow urine without clots. He was also able to drink liquids without problem. Gave him a dose of CTX to cover for hemorrhagic cystitis; Urine culture sent. The patient is otherwise well-appearing and at his neurologic baseline according to wife. He does not appear to have any gross hematuria to cause urinary obstruction at this time. Discussed outpatient treatment of UTI w/ keflex and close PCP f/u in 2-3 days for UA and creatinine recheck. Emphasized importance of continued hydration and reviewed return precautions including difficulty urinating, abdominal pain, or fever. Wife voiced understanding and will contact PCP Monday morning. Patient discharged in satisfactory condition.   Sharlett Iles, MD 09/04/14 [phone]

## 2014-09-04 NOTE — ED Notes (Signed)
IV attempt unsuccessful x 2.  2nd nurse attempt.

## 2014-09-04 NOTE — ED Notes (Signed)
Pt has UA results with him

## 2014-09-04 NOTE — ED Notes (Signed)
IV team at bedside 

## 2014-11-23 ENCOUNTER — Ambulatory Visit: Payer: Commercial Managed Care - HMO | Admitting: Interventional Cardiology

## 2014-12-10 ENCOUNTER — Other Ambulatory Visit: Payer: Self-pay | Admitting: Interventional Cardiology

## 2015-02-14 ENCOUNTER — Other Ambulatory Visit: Payer: Self-pay | Admitting: Interventional Cardiology

## 2015-02-23 ENCOUNTER — Encounter: Payer: Self-pay | Admitting: Interventional Cardiology

## 2015-02-23 ENCOUNTER — Ambulatory Visit (INDEPENDENT_AMBULATORY_CARE_PROVIDER_SITE_OTHER): Payer: Commercial Managed Care - HMO | Admitting: Interventional Cardiology

## 2015-02-23 VITALS — BP 144/70 | HR 53 | Ht 68.0 in | Wt 128.8 lb

## 2015-02-23 DIAGNOSIS — I739 Peripheral vascular disease, unspecified: Secondary | ICD-10-CM | POA: Diagnosis not present

## 2015-02-23 DIAGNOSIS — I25810 Atherosclerosis of coronary artery bypass graft(s) without angina pectoris: Secondary | ICD-10-CM | POA: Diagnosis not present

## 2015-02-23 DIAGNOSIS — F0391 Unspecified dementia with behavioral disturbance: Secondary | ICD-10-CM

## 2015-02-23 DIAGNOSIS — I10 Essential (primary) hypertension: Secondary | ICD-10-CM

## 2015-02-23 DIAGNOSIS — I25729 Atherosclerosis of autologous artery coronary artery bypass graft(s) with unspecified angina pectoris: Secondary | ICD-10-CM

## 2015-02-23 DIAGNOSIS — I209 Angina pectoris, unspecified: Secondary | ICD-10-CM

## 2015-02-23 NOTE — Patient Instructions (Signed)
Medication Instructions:  Your physician recommends that you continue on your current medications as directed. Please refer to the Current Medication list given to you today.  Labwork: None ordered.  Testing/Procedures: None ordered.  Follow-Up: Your physician recommends that you schedule a follow-up appointment as needed.   Any Other Special Instructions Will Be Listed Below (If Applicable).     If you need a refill on your cardiac medications before your next appointment, please call your pharmacy.   

## 2015-02-23 NOTE — Progress Notes (Signed)
Cardiology Office Note   Date:  02/23/2015   ID:  [name], DOB 1932/12/01, MRN [id number]  PCP:  Sean Shelling, MD  Cardiologist:  Sean Grooms, MD   Chief Complaint  Patient presents with  . Coronary Artery Disease      History of Present Illness: [name] is a 82 y.o. male who presents for  CABG with 2 prior past graft surgeries and percutaneous coronary intervention.   Major problem now is dementia and frailty. He denies angina. Relatively immobile. Within the past 18 months he is suffered a hip fracture from fall.    Past Medical History  Diagnosis Date  . Coronary artery disease   . Angina   . Hypertension   . Heart murmur   . Myocardial infarction ([place])     PT SEES DR Jolly Bleicher REQ STRESS, ECHO  . Peripheral vascular disease ([place])     PT HAS 2 STENTS  . Diabetes mellitus     BORDERLINE  NO CURRENT MEDS  . Cancer ([place])     SKIN CANCERS REMOVED   . Arthritis     DJD  . Stroke [facility])     Past Surgical History  Procedure Laterality Date  . Coronary artery bypass graft      79/84/93 CABG 95 CHEST HERNIA  . Cardiac catheterization    . Wrist surgery  2004    LEFT WRIST   . Hernia repair      CHEST AND HIATAL HERNIA SURGERY  . Cataract extraction, bilateral    . Total knee arthroplasty  11/20/2010    Procedure: TOTAL KNEE ARTHROPLASTY;  Surgeon: Kerin Salen;  Location: [place];  Service: Orthopedics;  Laterality: Right;  RIGHT ARTHROPLASTY KNEE TOTAL  . Joint replacement  June 2013    RIGHT KNEE      Current Outpatient Prescriptions  Medication Sig Dispense Refill  . amLODipine (NORVASC) 5 MG tablet Take 5 mg by mouth daily.      Marland Kitchen atorvastatin (LIPITOR) 40 MG tablet Take 40 mg by mouth every evening.     . calcium-vitamin D (OSCAL WITH D) 500-200 MG-UNIT per tablet Take 1 tablet by mouth 2 (two) times daily.      . citalopram (CELEXA) 10 MG tablet Take 5 mg by mouth daily.    . isosorbide mononitrate (IMDUR) 120 MG 24 hr  tablet TAKE 1 TABLET EVERY DAY 90 tablet 0  . memantine (NAMENDA) 10 MG tablet Take 10 mg by mouth daily.    . metoprolol (LOPRESSOR) 50 MG tablet Take 50 mg by mouth daily.      . multivitamin (THERAGRAN) per tablet Take 1 tablet by mouth daily.      . nitroGLYCERIN (NITROSTAT) 0.4 MG SL tablet PLACE 1 TABLET (0.4 MG TOTAL) UNDER THE TONGUE EVERY 5 (FIVE) MINUTES AS NEEDED FOR CHEST PAIN. 25 tablet 1  . RANEXA 500 MG 12 hr tablet TAKE 1 TABLET TWICE DAILY 180 tablet 0   No current facility-administered medications for this visit.    Allergies:   Contrast media and Ioxaglate    Social History:  The patient  reports that he quit smoking about 17 years ago. His smoking use included Cigarettes. He has never used smokeless tobacco. He reports that he does not drink alcohol or use illicit drugs.   Family History:  The patient's family history includes Heart disease in his father and sister; Hypertension in his sister; Kidney disease in his mother.  ROS:  Please see the history of present illness.   Otherwise, review of systems are positive for  Depression, anger, easy bruising, anxiety, difficulty with balance, constipation, and decreased appetite with weight loss..   All other systems are reviewed and negative.    PHYSICAL EXAM: VS:  BP 144/70 mmHg  Pulse 53  Ht 5\' 8"  (1.727 m)  Wt 128 lb 12.8 oz (58.423 kg)  BMI 19.59 kg/m2 , BMI Body mass index is 19.59 kg/(m^2). GEN: Well nourished, well developed, in no acute distress HEENT: normal Neck: no JVD, carotid bruits, or masses Cardiac: RRR.  There is no murmur, rub, or gallop. There is no edema. Respiratory:  clear to auscultation bilaterally, normal work of breathing. GI: soft, nontender, nondistended, + BS MS: no deformity or atrophy Skin: warm and dry, no rash Neuro:  Strength and sensation are intact Psych: euthymic mood, full affect   EKG:  EKG is ordered today. The ekg reveals  Normal sinus rhythm, right bundle branch block,  old inferior infarction.   Recent Labs: 09/04/2014: ALT 14*; BUN 36*; Creatinine, Ser 2.28*; Hemoglobin 11.3*; Platelets 208; Potassium 4.6; Sodium 137    Lipid Panel    Component Value Date/Time   CHOL  12/28/2006 0705    143        ATP III CLASSIFICATION:  <200     mg/dL   Desirable  200-239  mg/dL   Borderline High  >=240    mg/dL   High   TRIG 57 12/28/2006 0705   HDL 53 12/28/2006 0705   CHOLHDL 2.7 12/28/2006 0705   VLDL 11 12/28/2006 0705   LDLCALC  12/28/2006 0705    79        Total Cholesterol/HDL:CHD Risk Coronary Heart Disease Risk Table                     Men   Women  1/2 Average Risk   3.4   3.3      Wt Readings from Last 3 Encounters:  02/23/15 128 lb 12.8 oz (58.423 kg)  10/15/13 135 lb 1.9 oz (61.29 kg)  05/26/13 146 lb (66.225 kg)      Other studies Reviewed: Additional studies/ records that were reviewed today include:  none. The findings include  none.    ASSESSMENT AND PLAN:  1. Dementia, with behavioral disturbance  Aggressive behavior and disrespectful towards his wife.  2. Coronary artery disease involving autologous artery coronary bypass graft with unspecified angina pectoris  stable  3. PAD (peripheral artery disease) (HCC)  stable  4. Essential hypertension  controlled    Current medicines are reviewed at length with the patient today.  The patient has the following concerns regarding medicines:  none.  The following changes/actions have been instituted:     clinical follow-up as needed  Labs/ tests ordered today include:  No orders of the defined types were placed in this encounter.     Disposition:   FU with HS  As needed but does not need to be routine  Signed, Sean Grooms, MD  02/23/2015 11:57 AM    [facility] [address], [place], [place]  [address] Phone: [phone]; Fax: [phone]

## 2015-02-28 ENCOUNTER — Encounter: Payer: Self-pay | Admitting: Interventional Cardiology

## 2015-12-09 DEATH — deceased

## 2016-09-15 IMAGING — CT CT ABD-PELV W/O CM
2 of 4 series · 16 of 46 positions shown, 18 images · non-contrast
Comparison: 08/22/2007

CLINICAL DATA: Mid abdominal pain and swelling, hematuria, question
kidney stones, history coronary artery disease post MI and CABG,
diabetes mellitus, hypertension, stroke

EXAM:
CT ABDOMEN AND PELVIS WITHOUT CONTRAST
TECHNIQUE: Multidetector CT imaging of the abdomen and pelvis was performed
following the standard protocol without IV contrast. Sagittal and
coronal MPR images reconstructed from axial data set. Oral contrast
not administered for this indication.

[Series 2: abd/ pelvis 5.0 i30f 1 · axial · 0.69mm/px · z∈[+744,+1149]mm · 13 of 89 slices shown, 15 images]
[im 4/89  soft-tissue]
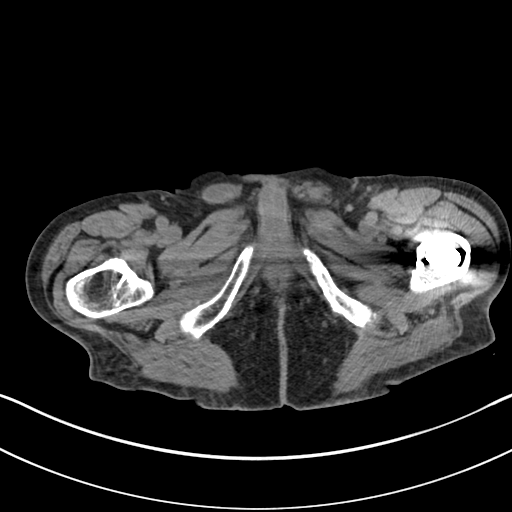
[im 4/89  bone]
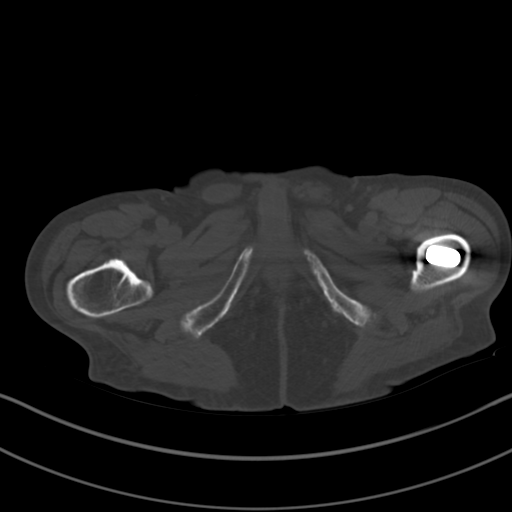
[im 12/89  soft-tissue]
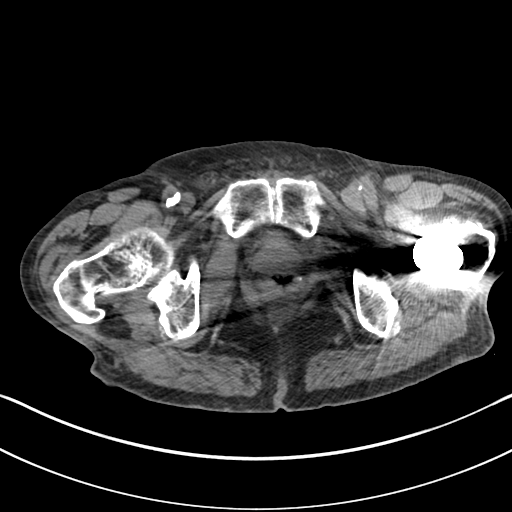
[im 19/89  soft-tissue]
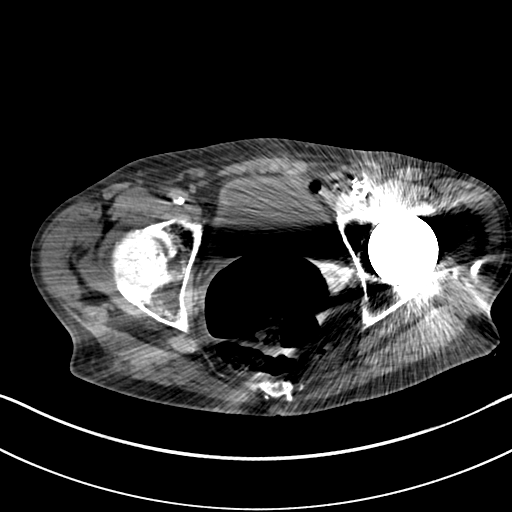
[im 26/89  soft-tissue]
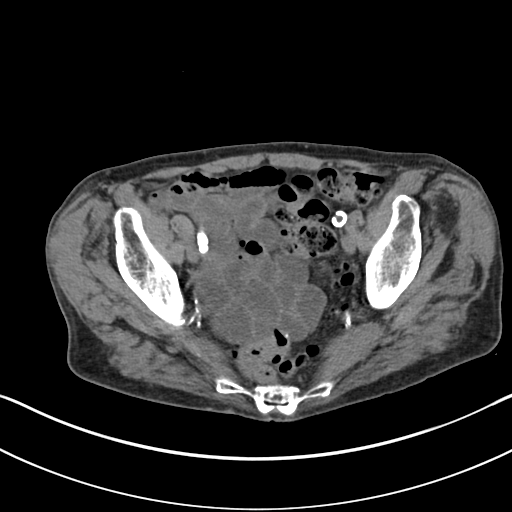
[im 30/89  soft-tissue]
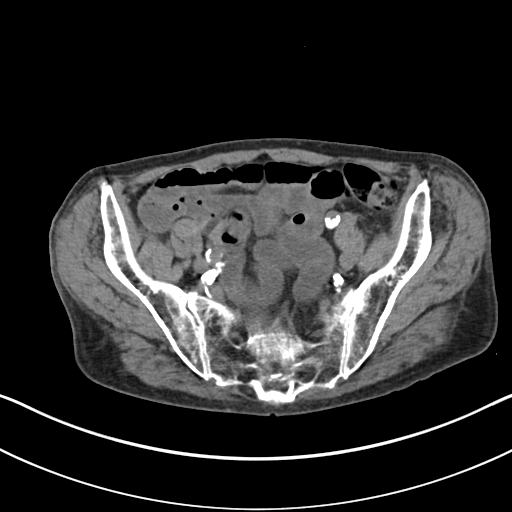
[im 37/89  soft-tissue]
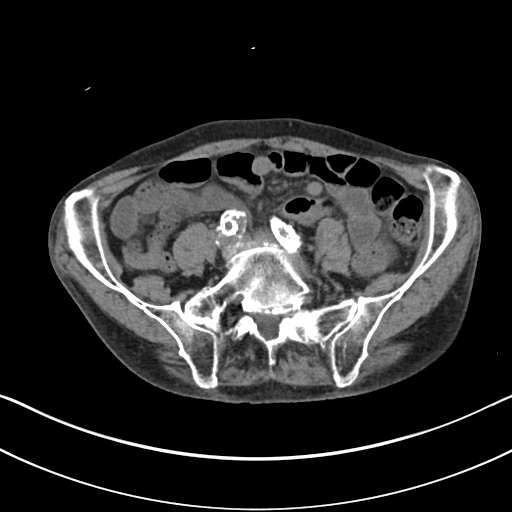
[im 45/89  soft-tissue]
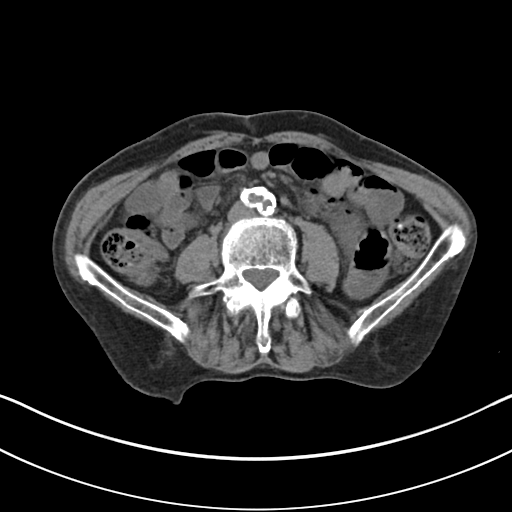
[im 52/89  soft-tissue]
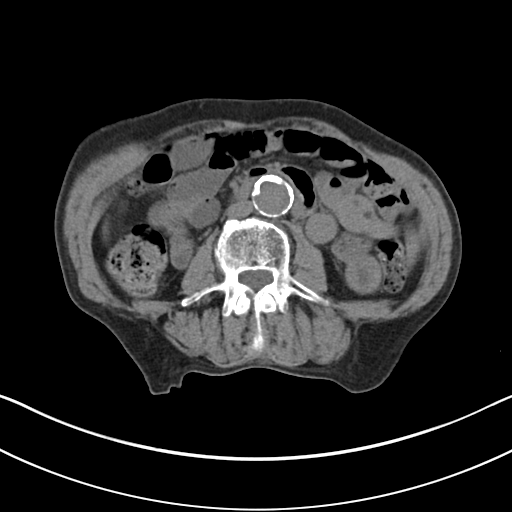
[im 59/89  soft-tissue]
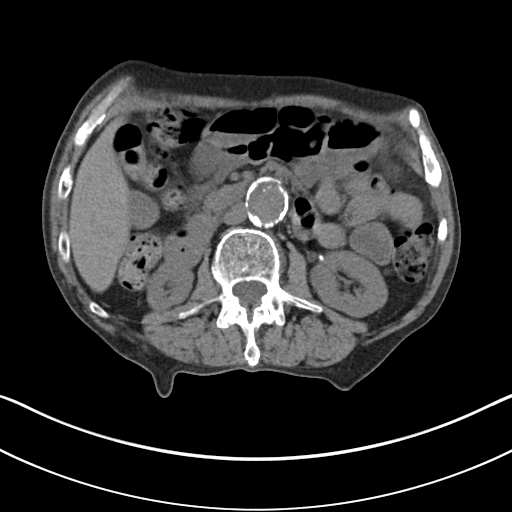
[im 59/89  bone]
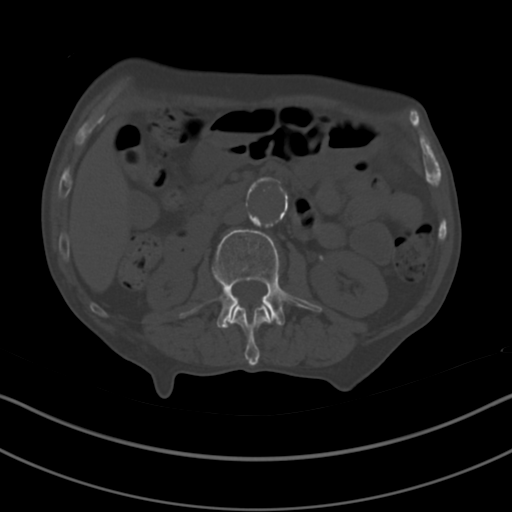
[im 63/89  soft-tissue]
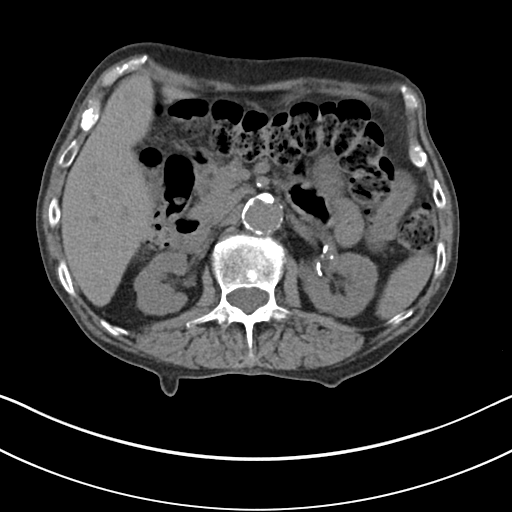
[im 70/89  soft-tissue]
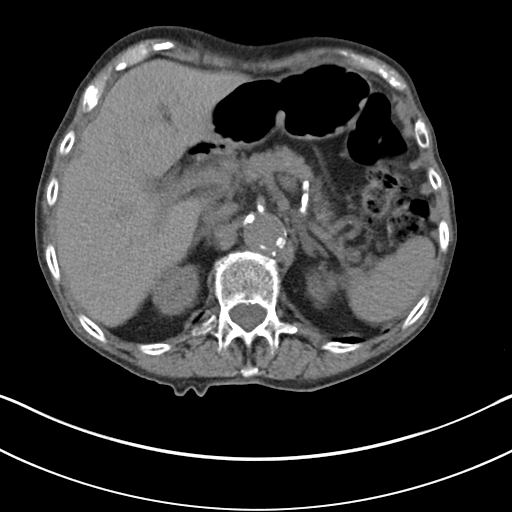
[im 78/89  soft-tissue]
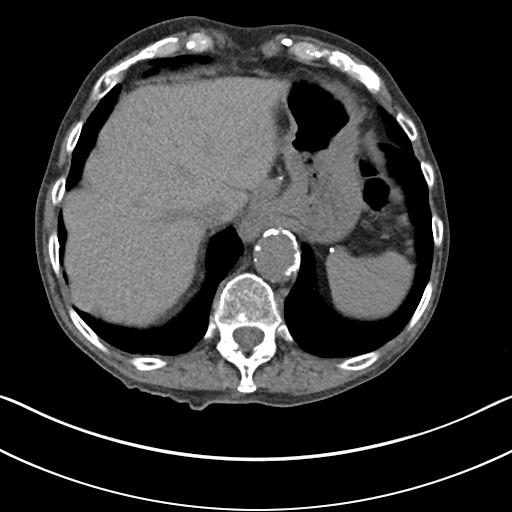
[im 85/89  soft-tissue]
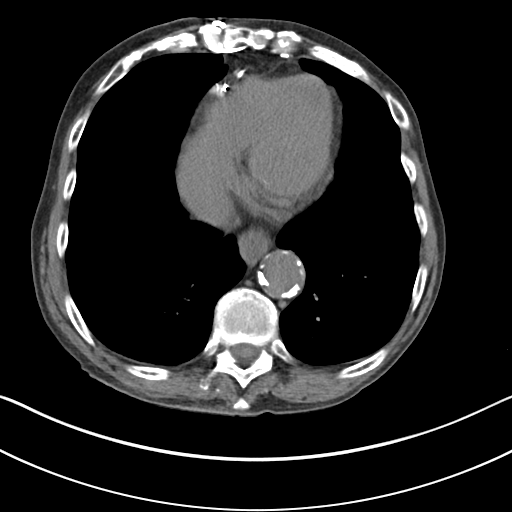

[Series 5: cor st · coronal · 0.76mm/px · 3 of 91 slices shown]
[im 31/91  soft-tissue]
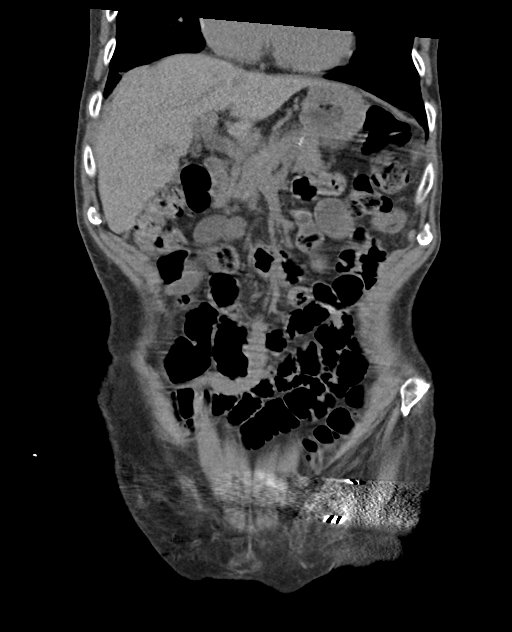
[im 41/91  soft-tissue]
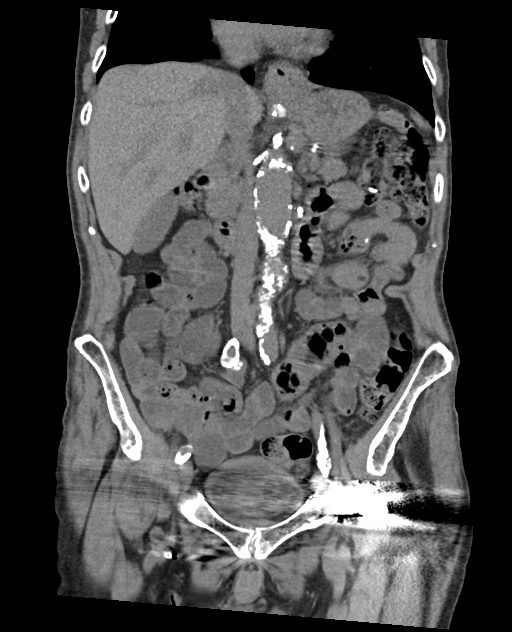
[im 51/91  soft-tissue]
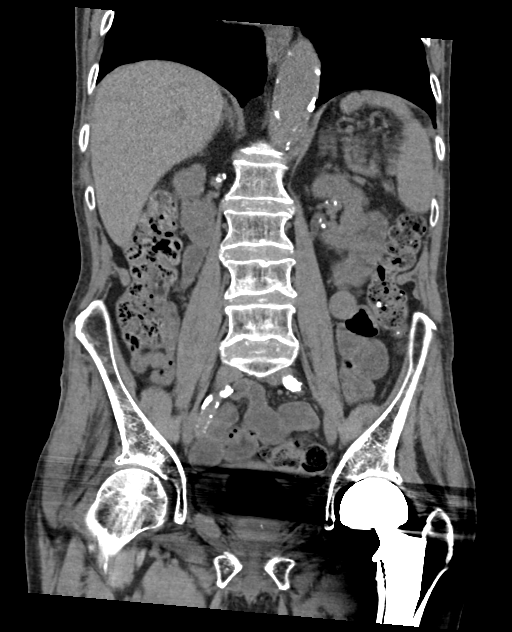

[16 of 46 positions shown; findings below may reference images not displayed]

FINDINGS: Emphysematous changes at the lung bases without infiltrate.

Extensive atherosclerotic calcifications aorta, coronary arteries,
and iliac arteries.

Stents identified within the iliac systems bilaterally.

Vascular calcifications at kidneys without definite nephrolithiasis.

No hydronephrosis, ureteral calcification or ureteral dilatation.

Significant portions of the distal ureters and bladder are obscured
by beam hardening artifacts from LEFT hip prosthesis.

Small fat attenuation focus at the posterior superior aspect of the
liver.

Liver, spleen, pancreas, kidneys and adrenal glands otherwise
unremarkable for technique.

Stomach and bowel loops grossly unremarkable for technique.

Normal appendix.

No mass, adenopathy, free air, or free fluid.

Diffuse osseous demineralization.
IMPRESSION: Extensive atherosclerotic disease changes.

COPD changes at lung bases.

No definite acute intra-abdominal or intrapelvic abnormalities.
